# Patient Record
Sex: Male | Born: 1960
Health system: Southern US, Community
[De-identification: ages and names within clinical notes are randomized; demographics above are authoritative.]

## PROBLEM LIST (undated history)

## (undated) DIAGNOSIS — I10 Essential (primary) hypertension: Secondary | ICD-10-CM

## (undated) DIAGNOSIS — J189 Pneumonia, unspecified organism: Secondary | ICD-10-CM

## (undated) DIAGNOSIS — F32A Depression, unspecified: Secondary | ICD-10-CM

## (undated) DIAGNOSIS — G4730 Sleep apnea, unspecified: Secondary | ICD-10-CM

## (undated) DIAGNOSIS — E7800 Pure hypercholesterolemia, unspecified: Secondary | ICD-10-CM

## (undated) DIAGNOSIS — I499 Cardiac arrhythmia, unspecified: Secondary | ICD-10-CM

## (undated) DIAGNOSIS — F329 Major depressive disorder, single episode, unspecified: Secondary | ICD-10-CM

## (undated) DIAGNOSIS — Z9889 Other specified postprocedural states: Secondary | ICD-10-CM

## (undated) DIAGNOSIS — Z87442 Personal history of urinary calculi: Secondary | ICD-10-CM

## (undated) DIAGNOSIS — M1990 Unspecified osteoarthritis, unspecified site: Secondary | ICD-10-CM

## (undated) DIAGNOSIS — K219 Gastro-esophageal reflux disease without esophagitis: Secondary | ICD-10-CM

## (undated) HISTORY — DX: Other specified postprocedural states: Z98.890

## (undated) HISTORY — DX: Major depressive disorder, single episode, unspecified: F32.9

## (undated) HISTORY — DX: Cardiac arrhythmia, unspecified: I49.9

## (undated) HISTORY — PX: BACK SURGERY: SHX140

## (undated) HISTORY — DX: Depression, unspecified: F32.A

## (undated) HISTORY — PX: KNEE SURGERY: SHX244

## (undated) HISTORY — PX: HERNIA REPAIR: SHX51

---

## 2004-01-17 ENCOUNTER — Encounter: Admission: RE | Admit: 2004-01-17 | Discharge: 2004-01-17 | Payer: Self-pay | Admitting: Orthopedic Surgery

## 2004-03-16 ENCOUNTER — Encounter: Admission: RE | Admit: 2004-03-16 | Discharge: 2004-03-16 | Payer: Self-pay

## 2004-04-06 ENCOUNTER — Encounter: Admission: RE | Admit: 2004-04-06 | Discharge: 2004-04-06 | Payer: Self-pay | Admitting: Orthopedic Surgery

## 2004-09-14 ENCOUNTER — Ambulatory Visit (HOSPITAL_COMMUNITY): Admission: RE | Admit: 2004-09-14 | Discharge: 2004-09-14 | Payer: Self-pay | Admitting: General Surgery

## 2004-10-24 ENCOUNTER — Encounter: Admission: RE | Admit: 2004-10-24 | Discharge: 2004-10-24 | Payer: Self-pay | Admitting: Internal Medicine

## 2004-11-06 ENCOUNTER — Encounter: Admission: RE | Admit: 2004-11-06 | Discharge: 2004-11-06 | Payer: Self-pay | Admitting: Internal Medicine

## 2005-04-24 ENCOUNTER — Encounter: Admission: RE | Admit: 2005-04-24 | Discharge: 2005-04-24 | Payer: Self-pay | Admitting: Internal Medicine

## 2005-05-10 ENCOUNTER — Encounter: Admission: RE | Admit: 2005-05-10 | Discharge: 2005-05-10 | Payer: Self-pay | Admitting: Family Medicine

## 2005-11-28 ENCOUNTER — Encounter: Admission: RE | Admit: 2005-11-28 | Discharge: 2005-11-28 | Payer: Self-pay | Admitting: Family Medicine

## 2005-12-30 ENCOUNTER — Ambulatory Visit (HOSPITAL_COMMUNITY): Admission: RE | Admit: 2005-12-30 | Discharge: 2005-12-30 | Payer: Self-pay | Admitting: Family Medicine

## 2006-03-24 ENCOUNTER — Emergency Department (HOSPITAL_COMMUNITY): Admission: EM | Admit: 2006-03-24 | Discharge: 2006-03-24 | Payer: Self-pay | Admitting: Emergency Medicine

## 2006-04-07 ENCOUNTER — Ambulatory Visit (HOSPITAL_COMMUNITY): Admission: RE | Admit: 2006-04-07 | Discharge: 2006-04-07 | Payer: Self-pay | Admitting: Unknown Physician Specialty

## 2006-10-10 ENCOUNTER — Encounter: Admission: RE | Admit: 2006-10-10 | Discharge: 2006-10-10 | Payer: Self-pay | Admitting: Family Medicine

## 2006-12-29 ENCOUNTER — Ambulatory Visit (HOSPITAL_COMMUNITY): Admission: RE | Admit: 2006-12-29 | Discharge: 2006-12-29 | Payer: Self-pay | Admitting: Internal Medicine

## 2007-12-13 ENCOUNTER — Encounter: Payer: Self-pay | Admitting: Orthopedic Surgery

## 2007-12-14 ENCOUNTER — Ambulatory Visit: Payer: Self-pay | Admitting: Orthopedic Surgery

## 2007-12-14 DIAGNOSIS — G5600 Carpal tunnel syndrome, unspecified upper limb: Secondary | ICD-10-CM | POA: Insufficient documentation

## 2007-12-28 ENCOUNTER — Telehealth: Payer: Self-pay | Admitting: Orthopedic Surgery

## 2009-02-21 ENCOUNTER — Encounter: Payer: Self-pay | Admitting: Orthopedic Surgery

## 2010-03-08 NOTE — Letter (Signed)
Summary: [facility] Referral returnedd no reply from patient  [facility]  [address] Edmund [address]  [place], [place] [address]   Phone: [phone]  Fax: [phone]      02/21/2009  [facility] / Referrals [facility]  [phone]  / Fax [phone]    Re:    RENNE PLATTS DOB:    11/20/1960    The above named patient has been referred back to our office regarding problem:  CTS   We have made several attempts to contact Mr. Poulson via telephone and letter.  To date, we have had no response.  Office notes of previous evaluation are attached.  Thank you for your kind referral.  Please feel free to contact our office if we can be of further service.  Sincerely,   [facility] Office of Dr. Terrance Mass, MD

## 2010-06-22 NOTE — Op Note (Signed)
[name], [name]               ACCOUNT NO.:  [id number]   MEDICAL RECORD NO.:  [id number]          PATIENT TYPE:  AMB   LOCATION:  DAY                           FACILITY:  [facility]   PHYSICIAN:  Dalia Heading, M.D.  DATE OF BIRTH:  07/04/1960   DATE OF PROCEDURE:  09/14/2004  DATE OF DISCHARGE:                                 OPERATIVE REPORT   PREOPERATIVE DIAGNOSIS:  Left inguinal hernia.   POSTOPERATIVE DIAGNOSIS:  Left inguinal hernia.   PROCEDURE:  Left inguinal herniorrhaphy.   SURGEON:  Dr. Franky Macho.   ANESTHESIA:  General.   INDICATIONS:  The patient is a 50 year old white male who presents with a  symptomatic left inguinal hernia. Risks and benefits of the procedure  including bleeding, infection, pain, and the possibly of recurrence of the  hernia were fully explained to the patient, who gave informed consent.   PROCEDURE NOTE:  The patient was placed in the supine position. After  general anesthesia was administered, the left groin region was prepped and  draped using the usual sterile technique with Betadine. Surgical site  confirmation was performed.   Transverse incision was made in left groin region down to the external  oblique aponeurosis. The  aponeurosis was incised to the external ring. The  ilioinguinal nerve was identified and retracted inferiorly from the  operative field. Penrose drain was placed around the spermatic cord. A  lipoma of the cord was excised. The patient was noted to have an indirect  hernia. This was freed away from the spermatic cord and inverted. A medium-  size polypropylene mesh plug was then placed in this region, secured to the  transversalis fascia using a 2-0 Novofil interrupted suture. An onlay  polypropylene mesh patch was then placed along the floor the inguinal canal  and secured superiorly to the conjoined tendon and inferiorly to the  shelving edge of Poupart's ligament using a 2-0 Novofil interrupted suture.  The  internal ring was recreated using a 2-0 Novofil interrupted suture. The  external oblique aponeurosis was reapproximated using a 2-0 Vicryl running  suture. The subcutaneous layer was reapproximated using a 3-0 Vicryl  interrupted suture. The skin was closed using a 4-0 Vicryl subcuticular  suture. Sensorcaine 0.5% was instilled in the surrounding wound. Dermabond  was then applied.   All tape and needle counts were correct at the end of the procedure. The  patient was awakened and transferred to PACU in stable condition.   COMPLICATIONS:  None.   SPECIMEN:  None.   BLOOD LOSS:  Minimal.       MAJ/MEDQ  D:  09/14/2004  T:  09/14/2004  Job:  [id number]

## 2010-06-22 NOTE — H&P (Signed)
NAMEEZEKIAH, [name]               ACCOUNT NO.:  [id number]   MEDICAL RECORD NO.:  [id number]           PATIENT TYPE:   LOCATION:                                 FACILITY:   PHYSICIAN:  Dalia Heading, M.D.  DATE OF BIRTH:  16-Sep-1960   DATE OF ADMISSION:  09/14/2004  DATE OF DISCHARGE:  LH                                HISTORY & PHYSICAL   CHIEF COMPLAINT:  Left inguinal hernia.   HISTORY OF PRESENT ILLNESS:  The patient is a 50 year old white male who is  referred for evaluation and treatment of a left inguinal hernia.  It has  been present for many months.  It is made worse with straining.   PAST MEDICAL HISTORY:  1.  Hypertension.  2.  Depression.   PAST SURGICAL HISTORY:  Knee surgery.   CURRENT MEDICATIONS:  1.  Baby aspirin.  2.  Quinapril 20 mg p.o. every day.  3.  Lexapro 10 mg p.o. every day.  4.  Vytorin one tablet p.o. every day.  5.  Coreg 6.25 mg p.o. b.i.d.   ALLERGIES:  IVP DYE.   REVIEW OF SYSTEMS:  Noncontributory.   PHYSICAL EXAMINATION:  GENERAL:  The patient is a well-developed, well-  nourished, white male in no acute distress.  VITAL SIGNS:  He is afebrile and vital signs are stable.  LUNGS:  Clear to auscultation with equal breath sounds bilaterally.  HEART:  Reveals a regular rate and rhythm without S3, S4, or murmurs.  ABDOMEN:  Soft, nontender, nondistended.  A reducible left inguinal hernia  is noted.  GENITOURINARY:  Reveals no hydrocele.   IMPRESSION:  Left inguinal hernia.   PLAN:  The patient is scheduled for a left inguinal herniorrhaphy on September 14, 2004.  The risks and benefits of the procedure including bleeding,  infection, pain, and recurrence of the hernia were fully explained to the  patient, gave informed consent.       MAJ/MEDQ  D:  09/04/2004  T:  09/04/2004  Job:  [id number]   cc:   Dalia Heading, M.D.  [address]  [place]  [place] [address]  Fax: [phone]   [facility]  Fax:  [phone]   Corrie Mckusick, M.D.  Fax: [phone]

## 2010-07-19 ENCOUNTER — Other Ambulatory Visit (HOSPITAL_COMMUNITY): Payer: Self-pay | Admitting: Internal Medicine

## 2010-07-19 DIAGNOSIS — K4090 Unilateral inguinal hernia, without obstruction or gangrene, not specified as recurrent: Secondary | ICD-10-CM

## 2010-07-23 ENCOUNTER — Inpatient Hospital Stay (HOSPITAL_COMMUNITY): Admission: RE | Admit: 2010-07-23 | Payer: Self-pay | Source: Ambulatory Visit

## 2011-03-14 ENCOUNTER — Other Ambulatory Visit (HOSPITAL_COMMUNITY): Payer: Self-pay | Admitting: Internal Medicine

## 2011-03-14 ENCOUNTER — Ambulatory Visit (HOSPITAL_COMMUNITY)
Admission: RE | Admit: 2011-03-14 | Discharge: 2011-03-14 | Disposition: A | Discharge status: Home / Self Care | Payer: Managed Care, Other (non HMO) | Source: Ambulatory Visit | Attending: Internal Medicine | Admitting: Internal Medicine

## 2011-03-14 DIAGNOSIS — M25559 Pain in unspecified hip: Secondary | ICD-10-CM | POA: Insufficient documentation

## 2011-03-14 DIAGNOSIS — M25552 Pain in left hip: Secondary | ICD-10-CM

## 2012-05-10 ENCOUNTER — Emergency Department (HOSPITAL_COMMUNITY)
Admission: EM | Admit: 2012-05-10 | Discharge: 2012-05-10 | Disposition: A | Discharge status: Home / Self Care | Payer: 59 | Attending: Emergency Medicine | Admitting: Emergency Medicine

## 2012-05-10 ENCOUNTER — Encounter (HOSPITAL_COMMUNITY): Payer: Self-pay | Admitting: Emergency Medicine

## 2012-05-10 ENCOUNTER — Emergency Department (HOSPITAL_COMMUNITY): Payer: 59

## 2012-05-10 DIAGNOSIS — R079 Chest pain, unspecified: Secondary | ICD-10-CM

## 2012-05-10 DIAGNOSIS — I4949 Other premature depolarization: Secondary | ICD-10-CM | POA: Insufficient documentation

## 2012-05-10 DIAGNOSIS — Z862 Personal history of diseases of the blood and blood-forming organs and certain disorders involving the immune mechanism: Secondary | ICD-10-CM | POA: Insufficient documentation

## 2012-05-10 DIAGNOSIS — R011 Cardiac murmur, unspecified: Secondary | ICD-10-CM | POA: Insufficient documentation

## 2012-05-10 DIAGNOSIS — I493 Ventricular premature depolarization: Secondary | ICD-10-CM

## 2012-05-10 DIAGNOSIS — Z79899 Other long term (current) drug therapy: Secondary | ICD-10-CM | POA: Insufficient documentation

## 2012-05-10 DIAGNOSIS — I10 Essential (primary) hypertension: Secondary | ICD-10-CM | POA: Insufficient documentation

## 2012-05-10 DIAGNOSIS — R0789 Other chest pain: Secondary | ICD-10-CM | POA: Insufficient documentation

## 2012-05-10 DIAGNOSIS — R42 Dizziness and giddiness: Secondary | ICD-10-CM | POA: Insufficient documentation

## 2012-05-10 DIAGNOSIS — R0602 Shortness of breath: Secondary | ICD-10-CM | POA: Insufficient documentation

## 2012-05-10 DIAGNOSIS — Z7982 Long term (current) use of aspirin: Secondary | ICD-10-CM | POA: Insufficient documentation

## 2012-05-10 DIAGNOSIS — Z8639 Personal history of other endocrine, nutritional and metabolic disease: Secondary | ICD-10-CM | POA: Insufficient documentation

## 2012-05-10 HISTORY — DX: Essential (primary) hypertension: I10

## 2012-05-10 HISTORY — DX: Pure hypercholesterolemia, unspecified: E78.00

## 2012-05-10 LAB — COMPREHENSIVE METABOLIC PANEL
ALT: 43 U/L (ref 0–53)
AST: 33 U/L (ref 0–37)
Albumin: 4.2 g/dL (ref 3.5–5.2)
Alkaline Phosphatase: 68 U/L (ref 39–117)
BUN: 15 mg/dL (ref 6–23)
CO2: 26 mEq/L (ref 19–32)
Calcium: 9.5 mg/dL (ref 8.4–10.5)
Chloride: 99 mEq/L (ref 96–112)
Creatinine, Ser: 1.09 mg/dL (ref 0.50–1.35)
GFR calc Af Amer: >90 mL/min (ref >90)
GFR calc non Af Amer: 78 mL/min — ABNORMAL LOW (ref >90)
Glucose, Bld: 70 mg/dL (ref 70–99)
Potassium: 3.7 mEq/L (ref 3.5–5.1)
Sodium: 135 mEq/L (ref 135–145)
Total Bilirubin: 0.6 mg/dL (ref 0.3–1.2)
Total Protein: 7.3 g/dL (ref 6.0–8.3)

## 2012-05-10 LAB — CBC WITH DIFFERENTIAL/PLATELET
Basophils Absolute: 0 10*3/uL (ref 0.0–0.1)
Basophils Relative: 1 % (ref 0–1)
Eosinophils Absolute: 0.2 10*3/uL (ref 0.0–0.7)
Eosinophils Relative: 4 % (ref 0–5)
HCT: 43.9 % (ref 39.0–52.0)
Hemoglobin: 15.6 g/dL (ref 13.0–17.0)
Lymphocytes Relative: 41 % (ref 12–46)
Lymphs Abs: 2.5 10*3/uL (ref 0.7–4.0)
MCH: 30.6 pg (ref 26.0–34.0)
MCHC: 35.5 g/dL (ref 30.0–36.0)
MCV: 86.2 fL (ref 78.0–100.0)
Monocytes Absolute: 0.5 10*3/uL (ref 0.1–1.0)
Monocytes Relative: 8 % (ref 3–12)
Neutro Abs: 2.9 10*3/uL (ref 1.7–7.7)
Neutrophils Relative %: 47 % (ref 43–77)
Platelets: 195 10*3/uL (ref 150–400)
RBC: 5.09 MIL/uL (ref 4.22–5.81)
RDW: 12.9 % (ref 11.5–15.5)
WBC: 6.2 10*3/uL (ref 4.0–10.5)

## 2012-05-10 LAB — TROPONIN I
Troponin I: <0.3 ng/mL (ref <0.30)
Troponin I: <0.3 ng/mL (ref <0.30)

## 2012-05-10 LAB — D-DIMER, QUANTITATIVE: D-Dimer, Quant: 0.32 ug/mL-FEU (ref 0.00–0.48)

## 2012-05-10 NOTE — ED Notes (Addendum)
Patient c/o left side chest pain that radiates into left arm. Per patient shortness of breath but no nausea, vomiting, or dizziness. Patient states he feels like his heart is skipping a beat. Patient reports taking x2 81mg  aspirin today.

## 2012-05-10 NOTE — ED Provider Notes (Signed)
History  This chart was scribed for Joshua Lennert, MD by Bennett Scrape, ED Scribe. This patient was seen in room [facility] and the patient's care was started at 3:13 PM.  CSN: [id number]  Arrival date & time 05/10/12  1408   First MD Initiated Contact with Patient 05/10/12 1513      Chief Complaint  Patient presents with  . Chest Pain     Patient is a 52 y.o. male presenting with chest pain. The history is provided by the patient. No language interpreter was used.  Chest Pain Pain location:  L chest Pain quality: pressure   Pain radiates to:  L arm Pain radiates to the back: no   Duration:  1 day Timing:  Intermittent Chronicity:  Recurrent Associated symptoms: shortness of breath   Associated symptoms: no abdominal pain, no back pain, no cough, no fatigue, no headache, no nausea and not vomiting     [name] is a 52 y.o. male who presents to the Emergency Department complaining of intermittent, gradually worsening left-sided CP described as pressure that radiates into the left elbow with associated SOB, lightheadedness and irregular heartbeat described as skipped beats. He states that the irregular beat and chest pressure occur simultaneously. The first episode occurred about one hour after eating dinner around 9 PM. He reports that he attributed that episode to indigestion and reports that the episode resolved but returned after eating lunch today. He states that he became concerned when the episode today lasted longer than last night's episode. He admits that he has experienced similar episodes intermittently for the past several years, the last episode occurring 2 to 3 years ago. He states that he wore a cardiac monitor for a week and a murmur was noted but was otherwise normal. He is on Coreg twice a day and is on HTN medications for the murmur but was told not to worry about it. He states that he is following up with a Cardiologist currently but states "I would rather  not continue with this doctor". He reports a prior stress test 4 to 5 years ago and states that he was told a catheterization was possible if "I continued on this route".  He denies nausea, emesis, diarrhea, diaphoresis and cough as associated symptoms. He has a h/o HTN and HLD. He is an occasional alcohol user but denies smoking.  Past Medical History  Diagnosis Date  . Hypertension   . High cholesterol     Past Surgical History  Procedure Laterality Date  . Hernia repair    . Knee surgery Right     x2    Family History  Problem Relation Age of Onset  . Coronary artery disease Father     History  Substance Use Topics  . Smoking status: Never Smoker   . Smokeless tobacco: Never Used  . Alcohol Use: No     Comment: occasional      Review of Systems  Constitutional: Negative for fatigue.  HENT: Negative for congestion, sinus pressure and ear discharge.   Eyes: Negative for discharge.  Respiratory: Positive for shortness of breath. Negative for cough.   Cardiovascular: Positive for chest pain.  Gastrointestinal: Negative for nausea, vomiting, abdominal pain and diarrhea.  Genitourinary: Negative for frequency and hematuria.  Musculoskeletal: Negative for back pain.  Skin: Negative for rash.  Neurological: Positive for light-headedness. Negative for seizures and headaches.  Psychiatric/Behavioral: Negative for hallucinations.    Allergies  Iohexol  Home Medications  No current outpatient  prescriptions on file.  Triage Vitals: BP 128/77  Pulse 66  Temp(Src) 97.7 F (36.5 C)  Resp 18  Ht 6\' 3"  (1.905 m)  Wt 270 lb (122.471 kg)  BMI 33.75 kg/m2  SpO2 98%  Physical Exam  Nursing note and vitals reviewed. Constitutional: He is oriented to person, place, and time. He appears well-developed.  HENT:  Head: Normocephalic and atraumatic.  Eyes: Conjunctivae and EOM are normal. No scleral icterus.  Neck: Neck supple. No thyromegaly present.  Cardiovascular: Normal  rate and regular rhythm.  Exam reveals no gallop and no friction rub.   Murmur heard.  Systolic murmur is present with a grade of 2/6  Occasional irregularities noted  Pulmonary/Chest: No stridor. He has no wheezes. He has no rales. He exhibits no tenderness.  Abdominal: He exhibits no distension. There is no tenderness. There is no rebound.  Musculoskeletal: Normal range of motion. He exhibits no edema.  Lymphadenopathy:    He has no cervical adenopathy.  Neurological: He is oriented to person, place, and time. Coordination normal.  Skin: No rash noted. No erythema.  Psychiatric: He has a normal mood and affect. His behavior is normal.    ED Course  Procedures (including critical care time)  DIAGNOSTIC STUDIES: Oxygen Saturation is 98% on room air, normal by my interpretation.    COORDINATION OF CARE: 3:21 PM-Discussed treatment plan which includes CXR, CBC panel, CMP and troponin with pt at bedside and pt agreed to plan.   5:59 PM- Pt rechecked and is resting comfortably. Informed pt of radiology and lab work results. Advised pt that PVCs are noted on the monitor at times of the symptoms. Discussed discharge plan which includes increasing Coreg dose with pt and pt agreed to plan. Also advised pt to follow up with cardiology referral tomorrow and pt agreed.  Labs Reviewed  COMPREHENSIVE METABOLIC PANEL - Abnormal; Notable for the following:    GFR calc non Af Amer 78 (*)    All other components within normal limits  CBC WITH DIFFERENTIAL  TROPONIN I  D-DIMER, QUANTITATIVE   Dg Chest 2 View  05/10/2012  *RADIOLOGY REPORT*  Clinical Data:  Chest pain, shortness of breath and hypertension.  CHEST - 2 VIEW  Comparison: None  Findings: The heart size and mediastinal contours are within normal limits.  Both lungs are clear.  The visualized skeletal structures are unremarkable.  IMPRESSION: No active disease.   Original Report Authenticated By: Irish Lack, M.D.      No diagnosis  found.    MDM   Date: 05/10/2012  Rate: 59  Rhythm: normal sinus rhythm  QRS Axis: normal  Intervals: normal  ST/T Wave abnormalities: normal  Conduction Disutrbances:none  Narrative Interpretation:   Old EKG Reviewed: unchanged     The chart was scribed for me under my direct supervision.  I personally performed the history, physical, and medical decision making and all procedures in the evaluation of this patient.Joshua Lennert, MD 05/10/12 [phone]

## 2012-05-10 NOTE — ED Notes (Addendum)
Pt states that he had an episode of chest pain last night around 1900 after eating his dinner an hour before. Pt states that he thought it was indigestion. The pain did pass. Pt states that the pain returned this afternoon. The patients states that he started to feel the chest pain as soon as he finished lunch today. Pt states he also had diarrhea a this time. Pt states he had an episode of chest pain two years ago and that he was informed that he has an irregular heart beat and no other findings were noted. The pt states that he can feel his heart "skip a beat". Pt states that with the new onset of chest pain he feels lightheaded and that he feels like he could pass out. Pt states he has pain radiating down in left arm and into his elbow. Pt states he has numbness and tingling in his left hand. Pt states he felt like skipping a beat looked at monitor and noted PVS on the cardiac heart monitor.

## 2012-05-14 ENCOUNTER — Encounter: Payer: Self-pay | Admitting: *Deleted

## 2012-05-15 ENCOUNTER — Ambulatory Visit (INDEPENDENT_AMBULATORY_CARE_PROVIDER_SITE_OTHER): Payer: 59 | Admitting: Cardiovascular Disease

## 2012-05-15 ENCOUNTER — Encounter: Payer: Self-pay | Admitting: Cardiovascular Disease

## 2012-05-15 VITALS — BP 102/80 | HR 60 | Ht 75.0 in | Wt 271.0 lb

## 2012-05-15 DIAGNOSIS — R55 Syncope and collapse: Secondary | ICD-10-CM

## 2012-05-15 DIAGNOSIS — I493 Ventricular premature depolarization: Secondary | ICD-10-CM | POA: Insufficient documentation

## 2012-05-15 DIAGNOSIS — I4949 Other premature depolarization: Secondary | ICD-10-CM

## 2012-05-15 DIAGNOSIS — R079 Chest pain, unspecified: Secondary | ICD-10-CM

## 2012-05-15 NOTE — Assessment & Plan Note (Signed)
Event monitor to document frequency and location Continue beta blocker ETT to r/o exercise induced arrhythmia and worening.  Echo to assess LV function.  Continue coreg and can take extra dose if needed

## 2012-05-15 NOTE — Progress Notes (Signed)
Patient ID: [name], male   DOB: November 24, 1960, 52 y.o.   MRN: [id number] [name] is a 52 y.o. male who was seen in ER 4/6 complaining of intermittent, gradually worsening left-sided CP described as pressure that radiates into the left elbow with associated SOB, lightheadedness and irregular heartbeat described as skipped beats. He states that the irregular beat and chest pressure occur simultaneously. The first episode occurred about one hour after eating dinner around 9 PM. He reports that he attributed that episode to indigestion and reports that the episode resolved but returned after eating lunch today. He states that he became concerned when the episode today lasted longer than last night's episode. He admits that he has experienced similar episodes intermittently for the past several years, the last episode occurring 2 to 3 years ago. He states that he wore a cardiac monitor for a week and a murmur was noted but was otherwise normal  Seen by [facility] at that time . He is on Coreg twice a day and is on HTN medications for the murmur but was told not to worry about it. He states that he is following up with a Cardiologist currently but states "I would rather not continue with this doctor". He reports a prior stress test 4 to 5 years ago and states that he was told a catheterization was possible if "I continued on this route". He denies nausea, emesis, diarrhea, diaphoresis and cough as associated symptoms. He has a h/o HTN and HLD. He is an occasional alcohol user but denies smoking.  ROS: Denies fever, malais, weight loss, blurry vision, decreased visual acuity, cough, sputum, SOB, hemoptysis, pleuritic pain, palpitaitons, heartburn, abdominal pain, melena, lower extremity edema, claudication, or rash.  All other systems reviewed and negative   General: Affect appropriate Healthy:  appears stated age HEENT: normal Neck supple with no adenopathy JVP normal no bruits no thyromegaly Lungs  clear with no wheezing and good diaphragmatic motion Heart:  S1/S2 no murmur,rub, gallop or click PMI normal Abdomen: benighn, BS positve, no tenderness, no AAA no bruit.  No HSM or HJR Distal pulses intact with no bruits No edema Neuro non-focal Skin warm and dry No muscular weakness  Medications Current Outpatient Prescriptions  Medication Sig Dispense Refill  . aspirin EC 81 MG tablet Take 81 mg by mouth every morning.      . carvedilol (COREG) 6.25 MG tablet Take 6.25 mg by mouth 2 (two) times daily.      Marland Kitchen escitalopram (LEXAPRO) 10 MG tablet Take 10 mg by mouth every evening.      . meloxicam (MOBIC) 15 MG tablet Take 15 mg by mouth every evening.      . Multiple Vitamins-Minerals (ALIVE MENS ENERGY PO) Take 1 tablet by mouth every morning.      . quinapril (ACCUPRIL) 20 MG tablet Take 20 mg by mouth every morning.       No current facility-administered medications for this visit.    Allergies Iohexol  Family History: Family History  Problem Relation Age of Onset  . Coronary artery disease Father     Social History: History   Social History  . Marital Status: Married    Spouse Name: N/A    Number of Children: N/A  . Years of Education: N/A   Occupational History  . Not on file.   Social History Main Topics  . Smoking status: Never Smoker   . Smokeless tobacco: Never Used  . Alcohol Use: No  Comment: occasional  . Drug Use: No  . Sexually Active: Not on file   Other Topics Concern  . Not on file   Social History Narrative  . No narrative on file    Electrocardiogram:  05/12/12  NSR rate 58 normal   Assessment and Plan

## 2012-05-15 NOTE — Patient Instructions (Addendum)
Your physician recommends that you schedule a follow-up appointment in: POST TEST  Your physician has recommended that you wear an event monitor. Event monitors are medical devices that record the heart's electrical activity. Doctors most often [place] these monitors to diagnose arrhythmias. Arrhythmias are problems with the speed or rhythm of the heartbeat. The monitor is a small, portable device. You can wear one while you do your normal daily activities. This is usually used to diagnose what is causing palpitations/syncope (passing out).TILL MAY 2ND  Your physician has requested that you have an exercise tolerance test. For further information please visit [email or web address] Please also follow instruction sheet, as given.  Your physician has requested that you have an echocardiogram. Echocardiography is a painless test that uses sound waves to create images of your heart. It provides your doctor with information about the size and shape of your heart and how well your heart's chambers and valves are working. This procedure takes approximately one hour. There are no restrictions for this procedure.

## 2012-05-15 NOTE — Assessment & Plan Note (Signed)
Atypical primarily ppt by palpitations.  F/U echo and ETT as ECG is normal

## 2012-05-21 DIAGNOSIS — R002 Palpitations: Secondary | ICD-10-CM

## 2012-05-25 ENCOUNTER — Ambulatory Visit (HOSPITAL_COMMUNITY)
Admission: RE | Admit: 2012-05-25 | Discharge: 2012-05-25 | Disposition: A | Discharge status: Home / Self Care | Payer: 59 | Source: Ambulatory Visit | Attending: Cardiovascular Disease | Admitting: Cardiovascular Disease

## 2012-05-25 ENCOUNTER — Encounter (HOSPITAL_COMMUNITY): Payer: Self-pay | Admitting: Cardiovascular Disease

## 2012-05-25 ENCOUNTER — Encounter (HOSPITAL_COMMUNITY): Payer: Self-pay | Admitting: *Deleted

## 2012-05-25 DIAGNOSIS — R079 Chest pain, unspecified: Secondary | ICD-10-CM

## 2012-05-25 DIAGNOSIS — I10 Essential (primary) hypertension: Secondary | ICD-10-CM | POA: Insufficient documentation

## 2012-05-25 DIAGNOSIS — R002 Palpitations: Secondary | ICD-10-CM | POA: Insufficient documentation

## 2012-05-25 DIAGNOSIS — R0989 Other specified symptoms and signs involving the circulatory and respiratory systems: Secondary | ICD-10-CM | POA: Insufficient documentation

## 2012-05-25 DIAGNOSIS — R55 Syncope and collapse: Secondary | ICD-10-CM

## 2012-05-25 DIAGNOSIS — R0609 Other forms of dyspnea: Secondary | ICD-10-CM | POA: Insufficient documentation

## 2012-05-25 DIAGNOSIS — I517 Cardiomegaly: Secondary | ICD-10-CM

## 2012-05-25 NOTE — Progress Notes (Signed)
*  PRELIMINARY RESULTS* Echocardiogram 2D Echocardiogram has been performed.  [name] 05/25/2012, 11:08 AM

## 2012-05-25 NOTE — Progress Notes (Addendum)
Stress Lab Nurses Notes - [name]  [name] 05/25/2012 Reason for doing test: Chest Pain and Lightheadedeness Type of test: Regular GTX Nurse performing test: Parke Poisson, RN Nuclear Medicine Tech: Not Applicable Echo Tech: Not Applicable MD performing test: P. Nishan & Joni Reining NP Family MD: Dr. Sherwood Gambler Test explained and consent signed: yes IV started: No IV started Symptoms: SOB & chest Pressure Treatment/Intervention: None Reason test stopped: reached target HR After recovery IV was: NA Patient to return to Nuc. Med at : NA Patient discharged: Home Patient's Condition upon discharge was: stable Comments: During test peak BP 199/81 & HR 146.  Recovery BP 145/73 & HR 77.  Symptoms resolved in recovery. [name]  Graded Exercise Test-Interpretation      Graded exercise performed to a work load of 11.1 METs and a heart rate of 146, 85 % of age-predicted maximum.  Exercise discontinued due to dyspnea and fatigue; no chest discomfort reported.  Blood pressure increased from a resting value of 135/80 to 200/80 at peak exercise , a normal response.  Frequent PVCs noted near peak exercise.  EKG: Sinus bradycardia at a heart rate of 48 bpm; otherwise normal. Stress EKG:  Insignificant upsloping ST segment depression during exercise until peak when 1.2-1.9 mm of upsloping and flat ST segment depression recorded in leads as well as V3-V6. Rapid reversion towards normal in recovery with insignificant flat and downsloping ST segment depression later in recovery. Impression:  Borderline positive graded exercise test in the absence of symptoms to suggest ischemia.  Rapid normalization of ST segments in recovery raises the question of a potentially false positive study.  Other findings as noted.  [place] Bing, M.D.

## 2012-06-01 ENCOUNTER — Telehealth: Payer: Self-pay | Admitting: Cardiovascular Disease

## 2012-06-01 NOTE — Telephone Encounter (Signed)
Spoke to pt to advise results/instructions. Pt understood. Pt concerned per notations made by Dr Macarthur Critchley as follows:  EKG: Sinus bradycardia at a heart rate of 48 bpm; otherwise normal.  Stress EKG: Insignificant upsloping ST segment depression during exercise until peak when 1.2-1.9 mm of upsloping and flat ST segment depression recorded in leads as well as V3-V6. Rapid reversion towards normal in recovery with insignificant flat and downsloping ST segment depression later in recovery.  Impression: Borderline positive graded exercise test in the absence of symptoms to suggest ischemia. Rapid normalization of ST segments in recovery raises the question of a potentially false positive study. Other findings as noted.  [place] Bing, M.D.   Pt has upcoming f/u apt with PN on 06-15-12, please advise if pt needs to come in sooner per does not really understand the fact that the results are noted as potentially false positive study.

## 2012-06-01 NOTE — Telephone Encounter (Signed)
Minor changes on his ECG  Note all of the times that he indicated "insignificant" finding

## 2012-06-01 NOTE — Telephone Encounter (Signed)
Results of myoview / tgs  °

## 2012-06-05 ENCOUNTER — Other Ambulatory Visit: Payer: Self-pay | Admitting: *Deleted

## 2012-06-05 DIAGNOSIS — R079 Chest pain, unspecified: Secondary | ICD-10-CM

## 2012-06-05 DIAGNOSIS — R55 Syncope and collapse: Secondary | ICD-10-CM

## 2012-06-08 NOTE — Telephone Encounter (Signed)
.  left message to have patient return my call with family on home phone and vm on mobile

## 2012-06-08 NOTE — Telephone Encounter (Signed)
Spoke to pt to advise results/instructions. Pt understood. Pt concerned that his heart still skiping periodically, noted today not at all, and also he notes lightheadedness on and off as well, noted pt recent event monitor scanned into the chart, pt wanted to know if he needs to address this prior to his f/u 06-15-12

## 2012-06-08 NOTE — Telephone Encounter (Signed)
His monitor did not show a significant arrhythmia no need for further tests

## 2012-06-09 NOTE — Telephone Encounter (Signed)
Spoke to pt to advise results/instructions. Pt understood.  

## 2012-06-15 ENCOUNTER — Ambulatory Visit (INDEPENDENT_AMBULATORY_CARE_PROVIDER_SITE_OTHER): Payer: 59 | Admitting: Cardiovascular Disease

## 2012-06-15 DIAGNOSIS — I493 Ventricular premature depolarization: Secondary | ICD-10-CM

## 2012-06-15 DIAGNOSIS — R9439 Abnormal result of other cardiovascular function study: Secondary | ICD-10-CM

## 2012-06-15 DIAGNOSIS — R55 Syncope and collapse: Secondary | ICD-10-CM

## 2012-06-15 DIAGNOSIS — I4949 Other premature depolarization: Secondary | ICD-10-CM

## 2012-06-15 MED ORDER — DILTIAZEM HCL ER COATED BEADS 120 MG PO CP24: 120.0000 mg | ORAL_CAPSULE | Freq: Every day | ORAL | Status: DC

## 2012-06-15 NOTE — Progress Notes (Signed)
Patient ID: [name], male   DOB: [birth year], 52 y.o.   MRN: [id number] [name] is a 52 y.o. male who was seen in ER 4/6 complaining of intermittent, gradually worsening left-sided CP described as pressure that radiates into the left elbow with associated SOB, lightheadedness and irregular heartbeat described as skipped beats. He states that the irregular beat and chest pressure occur simultaneously. The first episode occurred about one hour after eating dinner around 9 PM. He reports that he attributed that episode to indigestion and reports that the episode resolved but returned after eating lunch today. He states that he became concerned when the episode today lasted longer than last night's episode. He admits that he has experienced similar episodes intermittently for the past several years, the last episode occurring 2 to 3 years ago. He states that he wore a cardiac monitor for a week and a murmur was noted but was otherwise normal Seen by [facility] at that time . He is on Coreg twice a day and is on HTN medications for the murmur but was told not to worry about it. He states that he is following up with a Cardiologist currently but states "I would rather not continue with this doctor". He reports a prior stress test 4 to 5 years ago and states that he was told a catheterization was possible if "I continued on this route". He denies nausea, emesis, diarrhea, diaphoresis and cough as associated symptoms. He has a h/o HTN and HLD. He is an occasional alcohol user but denies smoking.  Since his initial visit 4/14  He continues to have issues with apparent PVC;s Reports some bad enough to cause presyncope.  Reviewed Test results  ETT:  Positive in recovery and patient has strong family history with father having 9 caths beginning at early age 55/21/14 Read by Dr Dietrich Pates Stress EKG: Insignificant upsloping ST segment depression during exercise until peak when 1.2-1.9 mm of upsloping and flat ST  segment depression recorded in leads as well as V3-V6. Rapid reversion towards normal in recovery with insignificant flat and downsloping ST segment depression later in recovery.  Impression: Borderline positive graded exercise test in the absence of symptoms to suggest ischemia. Rapid normalization of ST segments in recovery raises the question of a potentially false positive study. Other findings as noted.  Echo was normal with no evidence of structural heart diseae.  Event monitor showed fairly frequent interpolated PVC;s no NSVT  ROS: Denies fever, malais, weight loss, blurry vision, decreased visual acuity, cough, sputum, SOB, hemoptysis, pleuritic pain, palpitaitons, heartburn, abdominal pain, melena, lower extremity edema, claudication, or rash.  All other systems reviewed and negative  General: Affect appropriate Healthy:  appears stated age HEENT: normal Neck supple with no adenopathy JVP normal no bruits no thyromegaly Lungs clear with no wheezing and good diaphragmatic motion Heart:  S1/S2 no murmur, no rub, gallop or click PMI normal Abdomen: benighn, BS positve, no tenderness, no AAA no bruit.  No HSM or HJR Distal pulses intact with no bruits No edema Neuro non-focal Skin warm and dry No muscular weakness   Current Outpatient Prescriptions  Medication Sig Dispense Refill  . aspirin EC 81 MG tablet Take 81 mg by mouth every morning.      . carvedilol (COREG) 6.25 MG tablet Take 6.25 mg by mouth 2 (two) times daily.      Marland Kitchen escitalopram (LEXAPRO) 10 MG tablet Take 10 mg by mouth every evening.      . meloxicam (  MOBIC) 15 MG tablet Take 15 mg by mouth every evening.      . Multiple Vitamins-Minerals (ALIVE MENS ENERGY PO) Take 1 tablet by mouth every morning.      . quinapril (ACCUPRIL) 20 MG tablet Take 20 mg by mouth every morning.       No current facility-administered medications for this visit.    Allergies  Iohexol  Electrocardiogram:  4/8  SR rate 59   Normal 05/12/12  Assessment and Plan

## 2012-06-15 NOTE — Assessment & Plan Note (Signed)
Still not clear why this would be a sudden onset issue for patient. ETT not entirely normal with strong family history Favor anatomical study to further investigate CAD with cardiac CT.  If not approved will need myovue.  Since coreg not helping and some PVC;s are calcium channel mediated in patient s with apparantly normal structural hearts will stop and start cardizem.  Relative change in HR may also stop or decrease interpolated PVCS  Will refer to EP in [place] after change in med and fu testing for CAD.

## 2012-06-15 NOTE — Patient Instructions (Addendum)
Your physician has recommended you make the following change in your medication:  1) Stop coreg (carvedilol). 2) Start cardizem CD (diltiazem) 120 mg one capsule by mouth daily.  Non-Cardiac CT scanning (cardiac CT), (CAT scanning), is a noninvasive, special x-ray that produces cross-sectional images of the body using x-rays and a computer. CT scans help physicians diagnose and treat medical conditions. For some CT exams, a contrast material is used to enhance visibility in the area of the body being studied. CT scans provide greater clarity and reveal more details than regular x-ray exams.  You have been referred to : one of the EP (electrophysiologist) in the [place] office in the next 3-4 weeks.  Your physician recommends that you schedule a follow-up appointment in: 3 months with Dr. Eden Emms in the [place] or [place] office.

## 2012-06-25 ENCOUNTER — Telehealth: Payer: Self-pay | Admitting: *Deleted

## 2012-06-25 DIAGNOSIS — R55 Syncope and collapse: Secondary | ICD-10-CM

## 2012-06-25 DIAGNOSIS — R9430 Abnormal result of cardiovascular function study, unspecified: Secondary | ICD-10-CM

## 2012-06-25 NOTE — Telephone Encounter (Signed)
Message copied by Ovidio Kin on Thu Jun 25, 2012  5:11 PM ------      Message from: Connye Burkitt      Created: Thu Jun 25, 2012  9:49 AM      Regarding: FW: Cardiac CT         Please scheduled myoview per Nishan. Cardiac CT denied      ----- Message -----         From: Connye Burkitt         Sent: 06/15/2012   3:31 PM           To: Alois Cliche, LPN, Britt Bolognese, #      Subject: Cardiac CT                                                 Let me know when pre-cert is approved. Patient has uhc      ----- Message -----         From: Jefferey Pica, RN         Sent: 06/15/2012   3:16 PM           To: Connye Burkitt            I saw this patient in [place] with Dr. Eden Emms today. Please call him to set up a cardiac CT. If denied with pre-cert, will order a myoview on him. Please let Tresa Moore, LPN in [place] know when this is set.             ------

## 2012-06-25 NOTE — Telephone Encounter (Signed)
.  left message to have patient return my call with family member Placed order in chart for pt to have myoview instead of Ct Scan per insurance Sent to [facility] to schedule apt for [facility] asap, will call pt back with date/time instructions

## 2012-06-26 ENCOUNTER — Encounter: Payer: Self-pay | Admitting: *Deleted

## 2012-06-26 NOTE — Telephone Encounter (Signed)
Noted the following:  [facility] FOR 06/30/12 NEEDS TO ARRIVE AT 8:30. [place]. STRESS MYOVIEW  Advised pt, he will call back to day to advise if he can do this apt time Instruction letter placed at front desk, pt made aware

## 2012-06-26 NOTE — Telephone Encounter (Signed)
FYI: Pt contacted [facility] and advised the following:   Pt is not going to do the test at this time due to money. States that he would like to give the new medication some time to work and see if it helps. He has made a f/u for Aug with dr Eden Emms. He will discuss stress test again at that time.

## 2012-06-30 ENCOUNTER — Encounter (HOSPITAL_COMMUNITY): Payer: 59

## 2012-06-30 ENCOUNTER — Ambulatory Visit (HOSPITAL_COMMUNITY): Payer: 59

## 2012-07-01 ENCOUNTER — Institutional Professional Consult (permissible substitution): Payer: 59 | Admitting: Internal Medicine

## 2012-07-01 NOTE — Telephone Encounter (Signed)
Called Sharonda at [address] to confirm the pt update to not have the test has been received per advised CY and PN are not in the office today, Merita Norton noted that PN is aware and the information was to be documented by PN nurse as pt advised

## 2012-09-08 ENCOUNTER — Ambulatory Visit: Payer: 59 | Admitting: Cardiovascular Disease

## 2013-01-13 ENCOUNTER — Other Ambulatory Visit: Payer: Self-pay | Admitting: *Deleted

## 2013-01-13 MED ORDER — DILTIAZEM HCL ER COATED BEADS 120 MG PO CP24: 120.0000 mg | ORAL_CAPSULE | Freq: Every day | ORAL | Status: DC

## 2013-02-17 ENCOUNTER — Telehealth: Payer: Self-pay | Admitting: Cardiovascular Disease

## 2013-02-17 MED ORDER — DILTIAZEM HCL ER COATED BEADS 120 MG PO CP24: 120.0000 mg | ORAL_CAPSULE | Freq: Every day | ORAL | Status: DC

## 2013-02-17 NOTE — Telephone Encounter (Signed)
Patient needs refill on Diltiazem but is past due for appointment.  States that they don't have the money to come for his follow up.  Advised that i would leave message for the nurse. / tgs

## 2013-02-17 NOTE — Telephone Encounter (Signed)
rx called to [facility]

## 2014-01-14 ENCOUNTER — Other Ambulatory Visit: Payer: Self-pay

## 2014-01-14 MED ORDER — DILTIAZEM HCL ER COATED BEADS 120 MG PO CP24: 120.0000 mg | ORAL_CAPSULE | Freq: Every day | ORAL | Status: DC

## 2014-02-11 ENCOUNTER — Other Ambulatory Visit: Payer: Self-pay | Admitting: Cardiovascular Disease

## 2014-05-29 IMAGING — CR DG CHEST 2V
2 series · 2 of 2 positions shown · non-contrast
Comparison: None

CLINICAL DATA: Chest pain, shortness of breath and hypertension.

CHEST - 2 VIEW

[view not recorded (1 of 2)]
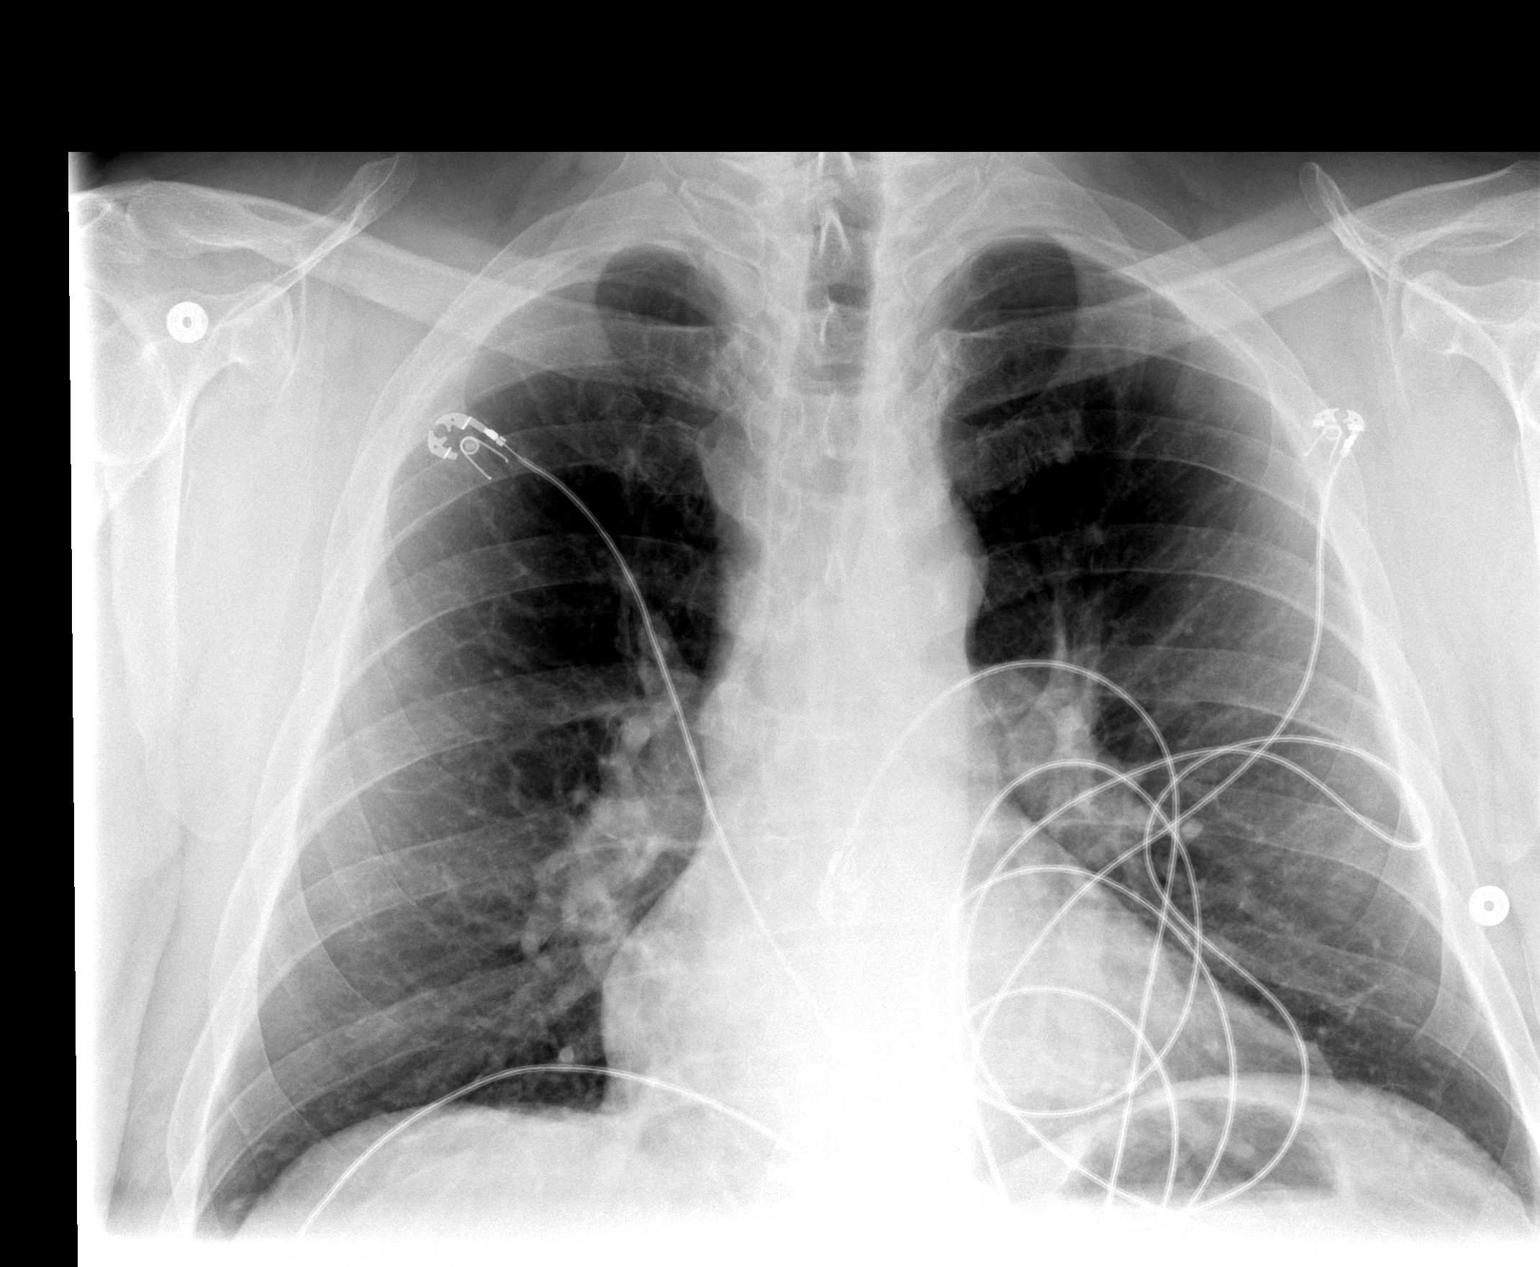

[view not recorded (2 of 2)]
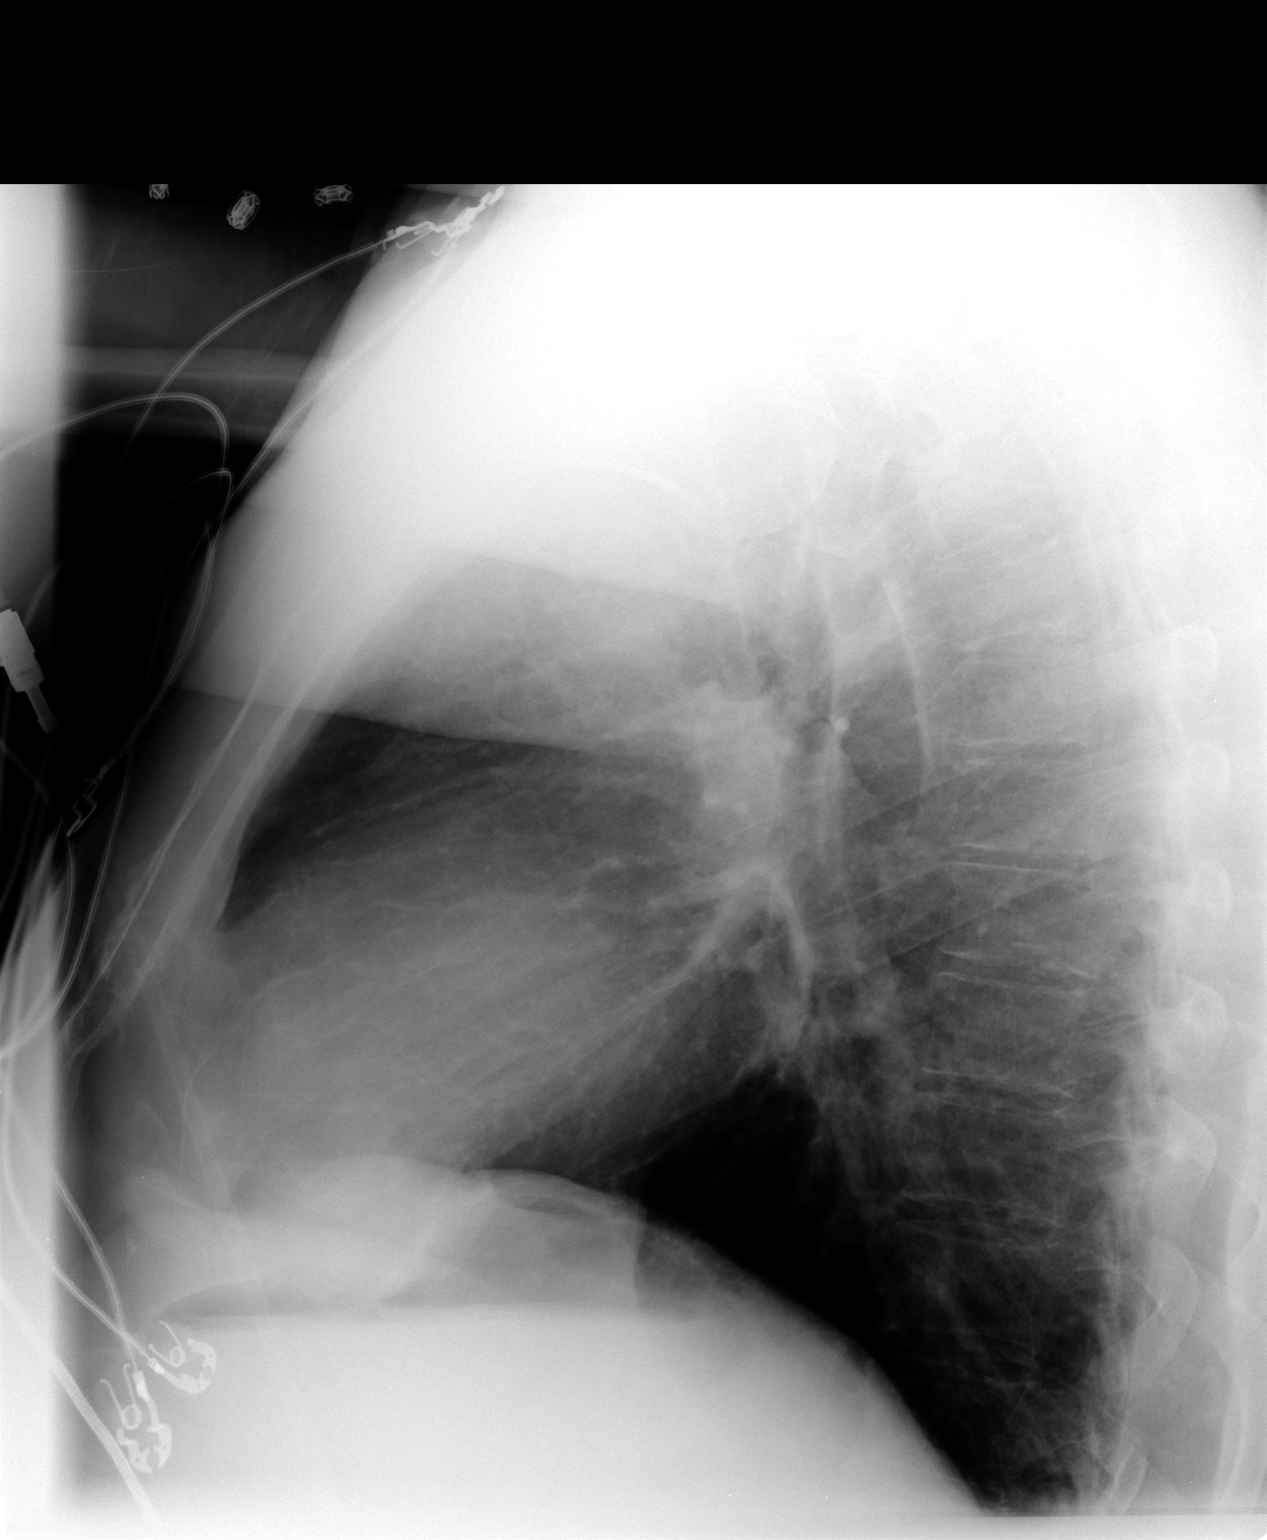

[2 of 2 positions shown; findings below may reference images not displayed]

FINDINGS: The heart size and mediastinal contours are within normal
limits.  Both lungs are clear.  The visualized skeletal structures
are unremarkable.
IMPRESSION: No active disease.

## 2014-10-14 ENCOUNTER — Ambulatory Visit (INDEPENDENT_AMBULATORY_CARE_PROVIDER_SITE_OTHER): Payer: 59 | Admitting: Neurology

## 2014-10-14 ENCOUNTER — Encounter: Payer: Self-pay | Admitting: Neurology

## 2014-10-14 VITALS — BP 104/80 | HR 68 | Ht 75.0 in | Wt 270.0 lb

## 2014-10-14 DIAGNOSIS — G4733 Obstructive sleep apnea (adult) (pediatric): Secondary | ICD-10-CM

## 2014-10-14 DIAGNOSIS — R413 Other amnesia: Secondary | ICD-10-CM

## 2014-10-14 DIAGNOSIS — E785 Hyperlipidemia, unspecified: Secondary | ICD-10-CM

## 2014-10-14 DIAGNOSIS — F329 Major depressive disorder, single episode, unspecified: Secondary | ICD-10-CM | POA: Diagnosis not present

## 2014-10-14 DIAGNOSIS — F32A Depression, unspecified: Secondary | ICD-10-CM

## 2014-10-14 DIAGNOSIS — I10 Essential (primary) hypertension: Secondary | ICD-10-CM | POA: Diagnosis not present

## 2014-10-14 NOTE — Progress Notes (Signed)
NEUROLOGY CONSULTATION NOTE  CLEMENCE Allen MRN: [id number] DOB: 07/03/1960  Referring provider: Dr. Elfredia Nevins Primary care provider: Dr. Elfredia Nevins  Reason for consult:  Memory loss  Dear Dr Sherwood Gambler:  Thank you for your kind referral of [name] for consultation of the above symptoms. Although his history is well known to you, please allow me to reiterate it for the purpose of our medical record. Records and images were personally reviewed where available.  HISTORY OF PRESENT ILLNESS: This is a 54 year old right-handed man with a history of hypertension, hyperlipidemia, depression, presenting for evaluation of worsening memory. 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 He has been working in the same job for the past 11 years. He denies getting lost driving, but he would be told to go to a certain place that he has been to dozens of times, but would have to be reminded where it is. He would be told he needs to do something, which he would forget 10 minutes later. This also happens at home with his wife telling him the same thing. He has to write things down but still forgets what he had written down. He rarely forgets to take his medications ("95% of the time"). He has noticed word-finding difficulties in the past 1-2 months, he could not recall the name of a person he has known for years, drawing a complete blank. He denies any family history of dementia. No history of significant head injuries. He usually drinks 3-4 beers a week.  He has been having a mild 2/10 frontal pressure headache for the past couple of weeks, occurring around twice a week, lasting 3-4 hours with no associated nausea, vomiting, photo or phonophobia. He denies any dizziness, diplopia, dysarthria, dysphagia, focal numbness/tingling/weakness, neck/back pain, bowel/bladder dysfunction, anosmia, or tremors. He usually gets 4-5 hours of  sleep and "fights" with his CPAP. He has been taking Lexapro  daily for the past 4-5 years, helping calm his temper down. He has a very short temper and still loses it at times, but not as he used. He reports being under significant financial stress, he has been in bankruptcy for the past 2 years.   He had an MRI brain without contrast done in 2008 for memory loss, confusion, headache, and blurred vision. He does not recall having the scan done. I personally reviewed study which was unremarkable.   PAST MEDICAL HISTORY: Past Medical History  Diagnosis Date  . Hypertension   . High cholesterol   . Depression   . Irregular heart rhythm     PAST SURGICAL HISTORY: Past Surgical History  Procedure Laterality Date  . Hernia repair    . Knee surgery Right     x2    MEDICATIONS: Current Outpatient Prescriptions on File Prior to Visit  Medication Sig Dispense Refill  . aspirin EC 81 MG tablet Take 81 mg by mouth every morning.    . diltiazem (CARDIZEM CD) 120 MG 24 hr capsule Take 1 capsule (120 mg total) by mouth daily. 30 capsule 0  . escitalopram (LEXAPRO) 10 MG tablet Take 10 mg by mouth every evening.    . meloxicam (MOBIC) 15 MG tablet Take 15 mg by mouth every evening.    . quinapril (ACCUPRIL) 20 MG tablet Take 20 mg by mouth every morning.     No current facility-administered medications on file prior to visit.  ALLERGIES: Allergies  Allergen Reactions  . Iohexol     FAMILY HISTORY: Family History  Problem Relation Age of Onset  . Coronary artery disease Father   . Heart disease Mother   . Diabetes Father     SOCIAL HISTORY: Social History   Social History  . Marital Status: Married    Spouse Name: N/A  . Number of Children: N/A  . Years of Education: N/A   Occupational History  . Not on file.   Social History Main Topics  . Smoking status: Never Smoker   . Smokeless tobacco: Never Used  . Alcohol Use: 0.0 oz/week    0 Standard drinks or equivalent  per week     Comment: 3-4 beers a week  . Drug Use: No  . Sexual Activity: Not on file   Other Topics Concern  . Not on file   Social History Narrative    REVIEW OF SYSTEMS: Constitutional: No fevers, chills, or sweats, no generalized fatigue, change in appetite Eyes: No visual changes, double vision, eye pain Ear, nose and throat: No hearing loss, ear pain, nasal congestion, sore throat Cardiovascular: No chest pain, palpitations Respiratory:  No shortness of breath at rest or with exertion, wheezes GastrointestinaI: No nausea, vomiting, diarrhea, abdominal pain, fecal incontinence Genitourinary:  No dysuria, urinary retention or frequency Musculoskeletal:  No neck pain, back pain Integumentary: No rash, pruritus, skin lesions Neurological: as above Psychiatric: +depression, insomnia, anxiety Endocrine: No palpitations, fatigue, diaphoresis, mood swings, change in appetite, change in weight, increased thirst Hematologic/Lymphatic:  No anemia, purpura, petechiae. Allergic/Immunologic: no itchy/runny eyes, nasal congestion, recent allergic reactions, rashes  PHYSICAL EXAM: Filed Vitals:   10/14/14 1244  BP: 104/80  Pulse: 68   General: No acute distress Head:  Normocephalic/atraumatic Eyes: Fundoscopic exam shows bilateral sharp discs, no vessel changes, exudates, or hemorrhages Neck: supple, no paraspinal tenderness, full range of motion Back: No paraspinal tenderness Heart: regular rate and rhythm Lungs: Clear to auscultation bilaterally. Vascular: No carotid bruits. Skin/Extremities: No rash, no edema Neurological Exam: Mental status: alert and oriented to person, place, and time, no dysarthria or aphasia, Fund of knowledge is appropriate.  Recent and remote memory are intact.  Attention and concentration are normal.    Able to name objects and repeat phrases.  MMSE - Mini Mental State Exam 10/14/2014  Orientation to time 5  Orientation to Place 5  Registration 3    Attention/ Calculation 5  Recall 2  Language- name 2 objects 2  Language- repeat 1  Language- follow 3 step command 3  Language- read & follow direction 1  Write a sentence 1  Copy design 1  Total score 29   Cranial nerves: CN I: not tested CN II: pupils equal, round and reactive to light, visual fields intact, fundi unremarkable. CN III, IV, VI:  full range of motion, no nystagmus, no ptosis CN V: facial sensation intact CN VII: upper and lower face symmetric CN VIII: hearing intact to finger rub CN IX, X: gag intact, uvula midline CN XI: sternocleidomastoid and trapezius muscles intact CN XII: tongue midline Bulk & Tone: normal, no fasciculations. Motor: 5/5 throughout with no pronator drift. Sensation: intact to light touch, cold, pin, vibration and joint position sense.  No extinction to double simultaneous stimulation.  Romberg test negative Deep Tendon Reflexes: brisk +2 throughout, no ankle clonus Plantar responses: downgoing bilaterally Cerebellar: no incoordination on finger to nose, heel to shin. No dysdiadochokinesia Gait: narrow-based and steady, able to tandem walk  adequately. Tremor: none  IMPRESSION: This is a 54 year old right-handed man with a history of hypertension, hyperlipidemia, depression, presenting for evaluation of worsening memory. His MMSE today is normal 29/30. Neurological exam normal. We discussed different causes of memory loss. If not done, recommend checking TSH and B12. We discussed effects of poorly controlled sleep apnea on memory, he reports 4-5 hours of sleep due to "fighting" with CPAP, would recommend having CPAP settings checked. We also discussed pseudodementia and effects of mood on memory. He endorses depression with his situation of being in bankruptcy, and will speak to Dr. Sherwood Gambler about increasing Lexapro to 20mg  daily. Counseling may be helpful. We discussed that if memory issues continue despite treatment of depression and sleep apnea,  we will proceed with brain imaging. We discussed the importance of control of vascular risk factors, physical exercise, and brain stimulation exercises for brain health. He will follow-up in 6 months and knows to call our office for any changes.   Thank you for allowing me to participate in the care of this patient. Please do not hesitate to call for any questions or concerns.   Patrcia Dolly, M.D.  CC: Dr. Sherwood Gambler

## 2014-10-14 NOTE — Patient Instructions (Signed)
1. Discuss increasing Lexapro dose with Dr. Sherwood Gambler 2. Have CPAP machine checked once able to see if it is at the right settings for you 3. Physical exercise and brain stimulation exercises (crossword puzzles, word search, etc) are important for brain health 4. Follow-up in 6 months

## 2014-10-15 DIAGNOSIS — E785 Hyperlipidemia, unspecified: Secondary | ICD-10-CM | POA: Insufficient documentation

## 2014-10-15 DIAGNOSIS — G4733 Obstructive sleep apnea (adult) (pediatric): Secondary | ICD-10-CM | POA: Insufficient documentation

## 2014-10-15 DIAGNOSIS — I10 Essential (primary) hypertension: Secondary | ICD-10-CM | POA: Insufficient documentation

## 2014-10-25 ENCOUNTER — Ambulatory Visit: Payer: Self-pay | Admitting: Neurology

## 2015-04-14 ENCOUNTER — Ambulatory Visit: Payer: 59 | Admitting: Neurology

## 2015-11-24 ENCOUNTER — Other Ambulatory Visit (HOSPITAL_COMMUNITY): Payer: 59

## 2015-11-30 ENCOUNTER — Ambulatory Visit: Admit: 2015-11-30 | Payer: 59 | Admitting: Ophthalmology

## 2015-11-30 SURGERY — PHACOEMULSIFICATION, CATARACT, WITH IOL INSERTION
Anesthesia: Monitor Anesthesia Care | Site: Eye | Laterality: Right

## 2016-12-19 ENCOUNTER — Telehealth: Payer: Self-pay

## 2016-12-19 NOTE — Telephone Encounter (Signed)
Pt called to set up his first colonoscopy. No GI issues, no blood thinners, no heart attacks. Please call him at [phone]

## 2016-12-30 ENCOUNTER — Telehealth: Payer: Self-pay

## 2016-12-30 NOTE — Telephone Encounter (Signed)
See separate triage.  

## 2017-01-30 NOTE — Telephone Encounter (Signed)
Gastroenterology Pre-Procedure Review  Request Date: 12/30/2016 Requesting Physician: Dr. Phillips OdorGolding  PATIENT REVIEW QUESTIONS: The patient responded to the following health history questions as indicated:    This will be the pt's first colonoscopy  1. Diabetes Melitis: no 2. Joint replacements in the past 12 months: no 3. Major health problems in the past 3 months: no 4. Has an artificial valve or MVP: no 5. Has a defibrillator: no 6. Has been advised in past to take antibiotics in advance of a procedure like teeth cleaning: no 7. Family history of colon cancer: NO  8. Alcohol Use: Rarely 9. History of sleep apnea: YES   HAS C PAP 10. History of coronary artery or other vascular stents placed within the last 12 months: no 11. History of any prior anesthesia complications: no    MEDICATIONS & ALLERGIES:    Patient reports the following regarding taking any blood thinners:   Plavix? no Aspirin? yes Coumadin? no Brilinta? no Xarelto? no Eliquis? no Pradaxa? no Savaysa? no Effient? no  Patient confirms/reports the following medications:  Current Outpatient Medications  Medication Sig Dispense Refill  . aspirin EC 81 MG tablet Take 81 mg by mouth every morning.    . diltiazem (CARDIZEM CD) 120 MG 24 hr capsule Take 1 capsule (120 mg total) by mouth daily. 30 capsule 0  . escitalopram (LEXAPRO) 10 MG tablet Take 10 mg by mouth every evening.    . meloxicam (MOBIC) 15 MG tablet Take 15 mg by mouth every evening. Takes one half tablet daily    . quinapril (ACCUPRIL) 20 MG tablet Take 20 mg by mouth every morning.     No current facility-administered medications for this visit.     Patient confirms/reports the following allergies:  Allergies  Allergen Reactions  . Iohexol     No orders of the defined types were placed in this encounter.   AUTHORIZATION INFORMATION Primary Insurance:   ID #:  Group #:  Pre-Cert / Auth required: Pre-Cert / Auth #:   Secondary Insurance:   ID #:  Group #:  Pre-Cert / Auth required:  Pre-Cert / Auth #:   SCHEDULE INFORMATION: Procedure has been scheduled as follows:  Date:  02/17/2017               Time:  8:30 aM Location: [facility] Short Stay  This Gastroenterology Pre-Precedure Review Form is being routed to the following provider(s): Jonette EvaSandi Fields, MD

## 2017-02-05 ENCOUNTER — Other Ambulatory Visit: Payer: Self-pay

## 2017-02-05 DIAGNOSIS — Z1211 Encounter for screening for malignant neoplasm of colon: Secondary | ICD-10-CM

## 2017-02-05 MED ORDER — NA SULFATE-K SULFATE-MG SULF 17.5-3.13-1.6 GM/177ML PO SOLN: 1.0000 | ORAL | 0 refills | Status: DC

## 2017-02-05 NOTE — Telephone Encounter (Signed)
Rx sent to the pharmacy and instructions mailed to pt.  

## 2017-02-05 NOTE — Telephone Encounter (Signed)
Appropriate.

## 2017-02-10 ENCOUNTER — Telehealth: Payer: Self-pay | Admitting: Gastroenterology

## 2017-02-10 MED ORDER — PEG 3350-KCL-NA BICARB-NACL 420 G PO SOLR: 4000.0000 mL | ORAL | 0 refills | Status: DC

## 2017-02-10 NOTE — Telephone Encounter (Signed)
Pt called to say that he hasn't received any instructions on his colonoscopy scheduled for 1/14 with SF. He also asked if we have a coupon or something cheaper in his prep. I told him that I would have to ask the nurse that scheduled his procedure and have her call him back. He said that he or his brother could come by the office one day this week to pick up the instructions. [phone]

## 2017-02-10 NOTE — Telephone Encounter (Signed)
Pt is aware instructions were mailed on 02/05/2017. He would like a cheaper prep. I am sending in [place] and faxing instructions to the pharmacy. He is aware to disregard the previous instructions for Suprep when he gets it in the mail.

## 2017-02-11 ENCOUNTER — Telehealth: Payer: Self-pay

## 2017-02-11 NOTE — Telephone Encounter (Signed)
I called [employer] and spoke to [place] [phone] for the screening colonoscopy.

## 2017-02-12 ENCOUNTER — Ambulatory Visit (INDEPENDENT_AMBULATORY_CARE_PROVIDER_SITE_OTHER): Payer: 59

## 2017-02-12 ENCOUNTER — Encounter (HOSPITAL_COMMUNITY): Payer: Self-pay | Admitting: Emergency Medicine

## 2017-02-12 ENCOUNTER — Ambulatory Visit (HOSPITAL_COMMUNITY)
Admission: EM | Admit: 2017-02-12 | Discharge: 2017-02-12 | Disposition: A | Discharge status: Home / Self Care | Payer: 59 | Attending: Family Medicine | Admitting: Family Medicine

## 2017-02-12 ENCOUNTER — Other Ambulatory Visit: Payer: Self-pay

## 2017-02-12 DIAGNOSIS — M545 Low back pain: Secondary | ICD-10-CM | POA: Diagnosis not present

## 2017-02-12 MED ORDER — HYDROCODONE-ACETAMINOPHEN 5-325 MG PO TABS: 1.0000 | ORAL_TABLET | Freq: Four times a day (QID) | ORAL | 0 refills | Status: AC | PRN

## 2017-02-12 NOTE — ED Provider Notes (Signed)
[facility]    CSN: [id number] Arrival date & time: 02/12/17  1406   History   Chief Complaint Chief Complaint  Patient presents with  . Back Pain    HPI AVARY PITSENBARGER is a 57 y.o. male presenting with acute back pain. States pain began on Monday and was mild and achy, started to improve this morning, but after he dropped off a truck load and got gas he got back in the truck cab and upon exiting he had extreme pain with tingling into bilateral legs, pain up into left ribcage. MRI in 2008 showing disc herniation at L2/L3, anterolisthesis at L5  And disc degeneration with protrusion at L5/S1. Received cortisone shots in the past that provided minimal relief, has not had any issues since. Denies loss of bowel/bladder control, denies saddle anesthesia.   HPI  Past Medical History:  Diagnosis Date  . Depression   . High cholesterol   . Hypertension   . Irregular heart rhythm     Patient Active Problem List   Diagnosis Date Noted  . Essential hypertension 10/15/2014  . Hyperlipidemia 10/15/2014  . Obstructive sleep apnea 10/15/2014  . Memory loss 10/14/2014  . Depression 10/14/2014  . Chest pain 05/15/2012  . PVC (premature ventricular contraction) 05/15/2012  . CARPAL TUNNEL SYNDROME 12/14/2007    Past Surgical History:  Procedure Laterality Date  . HERNIA REPAIR    . KNEE SURGERY Right    x2       Home Medications    Prior to Admission medications   Medication Sig Start Date End Date Taking? Authorizing Provider  aspirin EC 81 MG tablet Take 81 mg by mouth daily.    Yes [provider]  diltiazem (CARDIZEM CD) 120 MG 24 hr capsule Take 1 capsule (120 mg total) by mouth daily. 01/14/14  Yes Josue Hector, MD  escitalopram (LEXAPRO) 20 MG tablet Take 20 mg by mouth daily.   Yes [provider]  ibuprofen (ADVIL,MOTRIN) 200 MG tablet Take 400-600 mg by mouth daily as needed for headache.   Yes [provider]  meloxicam  (MOBIC) 7.5 MG tablet Take 7.5 mg by mouth daily.   Yes [provider]  Multiple Vitamin (MULTIVITAMIN WITH MINERALS) TABS tablet Take 1 tablet by mouth daily.   Yes [provider]  quinapril (ACCUPRIL) 20 MG tablet Take 20 mg by mouth daily.    Yes [provider]  HYDROcodone-acetaminophen (NORCO/VICODIN) 5-325 MG tablet Take 1 tablet by mouth every 6 (six) hours as needed for up to 4 days for severe pain. 02/12/17 02/16/17  Brandolyn Shortridge C, PA-C  Na Sulfate-K Sulfate-Mg Sulf (SUPREP BOWEL PREP KIT) 17.5-3.13-1.6 GM/177ML SOLN Take 1 kit by mouth as directed. Patient not taking: Reported on 02/10/2017 02/05/17   Annitta Needs, NP  polyethylene glycol-electrolytes (TRILYTE) 420 g solution Take 4,000 mLs by mouth as directed. 02/10/17   Annitta Needs, NP    Family History Family History  Problem Relation Age of Onset  . Coronary artery disease Father   . Diabetes Father   . Heart disease Mother     Social History Social History   Tobacco Use  . Smoking status: Never Smoker  . Smokeless tobacco: Never Used  Substance Use Topics  . Alcohol use: Yes    Alcohol/week: 0.0 oz    Comment: 3-4 beers a week  . Drug use: No     Allergies   Iohexol   Review of Systems Review of  Systems  Constitutional: Negative for chills and fever.  Eyes: Negative for pain and visual disturbance.  Respiratory: Negative for cough and shortness of breath.   Cardiovascular: Negative for chest pain and palpitations.  Gastrointestinal: Negative for abdominal pain, diarrhea, nausea and vomiting.  Genitourinary: Negative for decreased urine volume, difficulty urinating and dysuria.  Musculoskeletal: Positive for back pain. Negative for arthralgias.  Skin: Negative for color change and rash.  Neurological: Negative for dizziness, syncope, weakness, light-headedness, numbness and headaches.       Tingling in lower extrmities  All other systems reviewed and are  negative.    Physical Exam Triage Vital Signs ED Triage Vitals  Enc Vitals Group     BP 02/12/17 1428 (!) 147/87     Pulse Rate 02/12/17 1428 62     Resp 02/12/17 1428 18     Temp 02/12/17 1428 98.4 F (36.9 C)     Temp src --      SpO2 02/12/17 1428 100 %     Weight --      Height --      Head Circumference --      Peak Flow --      Pain Score 02/12/17 1430 9     Pain Loc --      Pain Edu? --      Excl. in [place]? --    No data found.  Updated Vital Signs BP (!) 147/87   Pulse 62   Temp 98.4 F (36.9 C)   Resp 18   SpO2 100%    Physical Exam  Constitutional: He appears well-developed and well-nourished.  Patient appearing uncomfortable  HENT:  Head: Normocephalic and atraumatic.  Eyes: Conjunctivae are normal.  Neck: Neck supple.  Cardiovascular: Normal rate and regular rhythm.  No murmur heard. Pulmonary/Chest: Effort normal and breath sounds normal. No respiratory distress.  Abdominal: Soft. There is no tenderness.  Musculoskeletal: He exhibits no edema.  Cervical, thoracic and lumbar spine without deformity or bony tenderness, no increased pain with palpation to lumbar muscles, patient point to bilateral lumbar area when asked about pain and moves laterally on both sides. Palpation does not cause pain. SLR limited due to pain.  Patient able to ambulate from chair to table, slowly and with awkaward posture to compensate pain.   Hip strength 5/5 bilaterally  Neurological: He is alert.  Skin: Skin is warm and dry.  Psychiatric: He has a normal mood and affect.  Nursing note and vitals reviewed.    UC Treatments / Results  Labs (all labs ordered are listed, but only abnormal results are displayed) Labs Reviewed - No data to display  EKG  EKG Interpretation None       Radiology Dg Lumbar Spine Complete  Result Date: 02/12/2017 CLINICAL DATA:  Acute back pain. EXAM: LUMBAR SPINE - COMPLETE 4+ VIEW COMPARISON:  Radiographs 02/16/2015 and MRI  01/09/2015 FINDINGS: Bilateral pars defects at L5 are again demonstrated with a grade 1 spondylolisthesis. The other lumbar vertebral bodies are normally aligned. Mild multilevel disc disease and facet disease. Advanced disc disease and facet disease at L5-S1. No acute bony findings. The visualized bony pelvis is intact. The SI joints appear normal. IMPRESSION: Stable pars defects at L5 with grade 1 spondylolisthesis and associated degenerative disc disease and facet disease at L5-S1. No acute bony findings. Electronically Signed   By: Marijo Sanes M.D.   On: 02/12/2017 16:11    Procedures Procedures (including critical care time)  Medications Ordered in UC  Medications - No data to display   Initial Impression / Assessment and Plan / UC Course  I have reviewed the triage vital signs and the nursing notes.  Pertinent labs & imaging results that were available during my care of the patient were reviewed by me and considered in my medical decision making (see chart for details).     Lumbar spine findings consistent with MR from 2008. Back pain likely from herniated disc vs. Muscular strain. Norco provided for temporary relief of severe pain. Anti-inflammatories for mild-moderate pain. Follow up with ortho for further imaging and management. Discussed strict return precautions. Patient verbalized understanding and is agreeable with plan.    Final Clinical Impressions(s) / UC Diagnoses   Final diagnoses:  Acute bilateral low back pain, with sciatica presence unspecified    ED Discharge Orders        Ordered    HYDROcodone-acetaminophen (NORCO/VICODIN) 5-325 MG tablet  Every 6 hours PRN     02/12/17 1604       Controlled Substance Prescriptions [place] Controlled Substance Registry consulted?Yes, checked 2 prescriptions in past 2 years. Benefit> Risk.    Janith Lima, [place] 02/12/17 1932

## 2017-02-12 NOTE — Discharge Instructions (Addendum)
Xray:Stable pars defects at L5 with grade 1 spondylolisthesis and associated degenerative disc disease and facet disease at L5-S1.  Please follow up with PCP and/or Orthopedics for further imaging of spine.  I have given you stronger pain medicine for severe pain.  Use anti-inflammatories for pain/swelling. You may take up to 800 mg Ibuprofen every 8 hours with food. You may supplement Ibuprofen with Tylenol [phone] mg every 8 hours.   Please alternate using ice/heating pad to help with any discomfort.   Please follow up here or with your primary care if symptoms not improving in 2 weeks. Please return sooner if pain changes, worsens, develop numbness, tingling.

## 2017-02-12 NOTE — ED Triage Notes (Signed)
Pt states Monday morning he woke up with mid lower back pain, thought he just slept wrong, over the course of 2 days pain got severe, pt visibly uncomfortable. Pain worse with ambulation. Denies issues with urination. Hx of back issues. States he has hx of a bulging disc

## 2017-02-13 ENCOUNTER — Telehealth: Payer: Self-pay

## 2017-02-13 ENCOUNTER — Telehealth (HOSPITAL_COMMUNITY): Payer: Self-pay | Admitting: Emergency Medicine

## 2017-02-13 MED ORDER — CYCLOBENZAPRINE HCL 10 MG PO TABS: 10.0000 mg | ORAL_TABLET | Freq: Three times a day (TID) | ORAL | 0 refills | Status: DC | PRN

## 2017-02-13 NOTE — Telephone Encounter (Signed)
Pt called office and LMOVM. He has TCS scheduled for 02/17/17. He has a bulging disk flare that started yesterday and wants to know if it will affect having his TCS.  Routing to DS.

## 2017-02-13 NOTE — Telephone Encounter (Signed)
Pt called...  Sts he is also needing a muscle relaxer along w/pain meds given  Per Dr. Tracie HarrierHagler... Ok to call in Flexeril 10 mg TID PRN #21 no refills  [facility] per pt's request.

## 2017-02-13 NOTE — Telephone Encounter (Signed)
I called and spoke to [facility] @ [employer] and made her aware that the date has been changed to 03/17/2017. PA # remains the same.  Reference number for this call is [id number].

## 2017-02-13 NOTE — Telephone Encounter (Signed)
Pt said he has bulging disks at L5 and S1. He was in severe pain yesterday. He is taking medication and wants to reschedule his procedure. He has been rescheduled for 03/17/2017 at 10:30 Am with Dr. Darrick PennaFields. He has his prep and has not opened it. I am mailing new instructions and Eber JonesCarolyn is aware of the change in Endo.

## 2017-02-20 DIAGNOSIS — M5416 Radiculopathy, lumbar region: Secondary | ICD-10-CM | POA: Insufficient documentation

## 2017-03-12 NOTE — Telephone Encounter (Signed)
PT called and is still out of work on [employer]. Not sure when he can do procedure.  We are cancelling and he will call me as soon as he is able and I will reschedule.  I called and LMOM for Eber JonesCarolyn that he has cancelled.

## 2017-03-17 ENCOUNTER — Encounter (HOSPITAL_COMMUNITY): Payer: Self-pay

## 2017-03-17 ENCOUNTER — Ambulatory Visit (HOSPITAL_COMMUNITY): Admit: 2017-03-17 | Payer: 59 | Admitting: Gastroenterology

## 2017-03-17 SURGERY — COLONOSCOPY
Anesthesia: Moderate Sedation

## 2017-04-21 ENCOUNTER — Ambulatory Visit: Payer: Self-pay | Admitting: Orthopedic Surgery

## 2017-04-29 ENCOUNTER — Ambulatory Visit: Payer: Self-pay | Admitting: Orthopedic Surgery

## 2017-04-29 NOTE — Pre-Procedure Instructions (Signed)
Del Mena GoesL Daris  04/29/2017      [facility] - [place], [place] - [id number] [place] [id number] Phone: [phone] Fax: [phone]    Your procedure is scheduled on Thurs., May 08, 2017  [facility]   Call this number if you have problems the morning of surgery:  [phone]   Remember:  Do not eat food or drink liquids after midnight.  Take these medicines the morning of surgery with A SIP OF WATER: Diltiazem (CARDIZEM CD), Escitalopram (LEXAPRO), and Gabapentin (NEURONTIN).  Follow your doctor's instruction regarding Aspirin.  7 days before surgery (Mar. 28), stop taking all Aspirins, Vitamins, Fish oils, and Herbal medications. Also stop all NSAIDS i.e. Advil, Ibuprofen, Motrin, Aleve, Anaprox, Naproxen, BC and Goody Powders. Including: Meloxicam (MOBIC) and Bismuth subsalicylate (PEPTO BISMOL).   Do not wear jewelry.  Do not wear lotions, powders, colognes, or deodorant.  Do not shave 48 hours prior to surgery.  Men may shave face and neck.  Do not bring valuables to the hospital.  [facility] is not responsible for any belongings or valuables.  Contacts, dentures or bridgework may not be worn into surgery.  Leave your suitcase in the car.  After surgery it may be brought to your room.  For patients admitted to the hospital, discharge time will be determined by your treatment team.  Patients discharged the day of surgery will not be allowed to drive home.   Special instructions:   [place]- Preparing For Surgery  Before surgery, you can play an important role. Because skin is not sterile, your skin needs to be as free of germs as possible. You can reduce the number of germs on your skin by washing with CHG (chlorahexidine gluconate) Soap before surgery.  CHG is an antiseptic cleaner which kills germs and bonds with the skin to continue killing germs even after washing.  Please do not use  if you have an allergy to CHG or antibacterial soaps. If your skin becomes reddened/irritated stop using the CHG.  Do not shave (including legs and underarms) for at least 48 hours prior to first CHG shower. It is OK to shave your face.  Please follow these instructions carefully.   1. Shower the NIGHT BEFORE SURGERY and the MORNING OF SURGERY with CHG.   2. If you chose to wash your hair, wash your hair first as usual with your normal shampoo.  3. After you shampoo, rinse your hair and body thoroughly to remove the shampoo.  4. Use CHG as you would any other liquid soap. You can apply CHG directly to the skin and wash gently with a scrungie or a clean washcloth.   5. Apply the CHG Soap to your body ONLY FROM THE NECK DOWN.  Do not use on open wounds or open sores. Avoid contact with your eyes, ears, mouth and genitals (private parts). Wash Face and genitals (private parts)  with your normal soap.  6. Wash thoroughly, paying special attention to the area where your surgery will be performed.  7. Thoroughly rinse your body with warm water from the neck down.  8. DO NOT shower/wash with your normal soap after using and rinsing off the CHG Soap.  9. Pat yourself dry with a CLEAN TOWEL.  10. Wear CLEAN PAJAMAS to bed the night before surgery, wear comfortable clothes the morning of surgery  11. Place CLEAN SHEETS on your bed the night of your first  shower and DO NOT SLEEP WITH PETS.  Day of Surgery: Do not apply any deodorants/lotions. Please wear clean clothes to the hospital/surgery center.    Please read over the following fact sheets that you were given. Pain Booklet, Coughing and Deep Breathing, MRSA Information and Surgical Site Infection Prevention

## 2017-04-30 ENCOUNTER — Encounter (HOSPITAL_COMMUNITY): Payer: Self-pay

## 2017-04-30 ENCOUNTER — Ambulatory Visit: Payer: Self-pay | Admitting: Orthopedic Surgery

## 2017-04-30 ENCOUNTER — Encounter (HOSPITAL_COMMUNITY)
Admission: RE | Admit: 2017-04-30 | Discharge: 2017-04-30 | Disposition: A | Discharge status: Home / Self Care | Payer: No Typology Code available for payment source | Source: Ambulatory Visit | Attending: Orthopedic Surgery | Admitting: Orthopedic Surgery

## 2017-04-30 ENCOUNTER — Encounter (HOSPITAL_COMMUNITY)
Admission: RE | Admit: 2017-04-30 | Discharge: 2017-04-30 | Disposition: A | Discharge status: Home / Self Care | Payer: No Typology Code available for payment source | Source: Ambulatory Visit | Attending: Specialist | Admitting: Specialist

## 2017-04-30 ENCOUNTER — Other Ambulatory Visit: Payer: Self-pay

## 2017-04-30 DIAGNOSIS — Z01810 Encounter for preprocedural cardiovascular examination: Secondary | ICD-10-CM | POA: Diagnosis not present

## 2017-04-30 DIAGNOSIS — M5126 Other intervertebral disc displacement, lumbar region: Secondary | ICD-10-CM | POA: Diagnosis not present

## 2017-04-30 DIAGNOSIS — Z01812 Encounter for preprocedural laboratory examination: Secondary | ICD-10-CM | POA: Insufficient documentation

## 2017-04-30 HISTORY — DX: Personal history of urinary calculi: Z87.442

## 2017-04-30 HISTORY — DX: Sleep apnea, unspecified: G47.30

## 2017-04-30 HISTORY — DX: Unspecified osteoarthritis, unspecified site: M19.90

## 2017-04-30 HISTORY — DX: Gastro-esophageal reflux disease without esophagitis: K21.9

## 2017-04-30 HISTORY — DX: Pneumonia, unspecified organism: J18.9

## 2017-04-30 LAB — BASIC METABOLIC PANEL
ANION GAP: 11 (ref 5–15)
BUN: 12 mg/dL (ref 6–20)
CALCIUM: 9.3 mg/dL (ref 8.9–10.3)
CO2: 20 mmol/L — AB (ref 22–32)
CREATININE: 1.02 mg/dL (ref 0.61–1.24)
Chloride: 107 mmol/L (ref 101–111)
GFR calc Af Amer: >60 mL/min (ref >60)
GLUCOSE: 100 mg/dL — AB (ref 65–99)
Potassium: 4.4 mmol/L (ref 3.5–5.1)
Sodium: 138 mmol/L (ref 135–145)

## 2017-04-30 LAB — CBC
HCT: 43.8 % (ref 39.0–52.0)
HEMOGLOBIN: 15 g/dL (ref 13.0–17.0)
MCH: 30.1 pg (ref 26.0–34.0)
MCHC: 34.2 g/dL (ref 30.0–36.0)
MCV: 88 fL (ref 78.0–100.0)
PLATELETS: 191 10*3/uL (ref 150–400)
RBC: 4.98 MIL/uL (ref 4.22–5.81)
RDW: 13.6 % (ref 11.5–15.5)
WBC: 7.1 10*3/uL (ref 4.0–10.5)

## 2017-04-30 LAB — SURGICAL PCR SCREEN
MRSA, PCR: NEGATIVE
STAPHYLOCOCCUS AUREUS: POSITIVE — AB

## 2017-04-30 NOTE — Progress Notes (Signed)
Anesthesia chart review: Patient is a 57 year old male scheduled for L2-3 microlumbar decompression on 05/08/2017 by Dr. Jene Every.  History includes never smoker, HTN, hypercholesterolemia, depression, irregular heart rate (PVCs, PACs), GERD, OSA (CPAP), arthritis, left inguinal hernia repair '06, knee surgery. BMI is consistent with obesity.   PCP is Dr. Elfredia Nevins, reportedly seen for surgical clearance on 04/30/17. Records pending.   He is not currently seeing a cardiologist, but has seen cardiologist Dr. Charlton Haws in the past. Established 05/15/12 following ED visit for chest pain. ETT was borderline but no symptoms of ischemia. Holter showed occasional to frequent PVCs. On last visit 06/15/12, Dr. Eden Emms wrote, "Still not clear why this [PVCs] would be a sudden onset issue for patient. ETT not entirely normal with strong family history Favor anatomical study to further investigate CAD with cardiac CT.  If not approved will need myovue.  Since coreg not helping and some PVC;s are calcium channel mediated in patient s with apparantly normal structural hearts will stop and start cardizem.  Relative change in HR may also stop or decrease interpolated PVCS  Will refer to EP in [place] after change in med and fu testing for CAD." Patient's insurance would not cover cardiac CT and wanted Myoview done first. Patient did not have the Myoview done due to cost. Dr. Eden Emms was to readdress in 09/2012, but appointment never happened. (Patient reports symptoms improved with Cardizem which has been refilled by Dr. Sherwood Gambler, so he has not gone back to cardiology.)   Meds include aspirin 81 mg (holding 5 days prior to surgery), Cardizem CD, Lexapro, Neurontin, quinapril.  BP 140/81   Pulse (!) 54   Temp 36.6 C   Resp 20   Ht 6\' 3"  (1.905 m)   Wt 294 lb 11.2 oz (133.7 kg)   SpO2 98%   BMI 36.83 kg/m   He denied SOB, fever, cough, chest pain, syncope. When he first was evaluated for chest pain in 2014 he  was having skipped beats associated with chest pressure days at a time. Since starting on Cardizem symptoms only occur about every 3-4 months and last for ~ 5 minutes. He will still have associated chest pressure that correlates with his "extra" beats. He denied associated SOB, diaphoresis, nausea, jaw or arm pain, syncope/pre-syncope. He reported that he was active until 02/12/17 when he stepped out of his truck and developed acute back pain which never resolved. Since his back symptoms, his activity is limited due to pain, and he has gained weight from inactivity.  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 One of his job sites required walking up 70 stairs and he reported that he was able to do that without chest pain or significant SOB. He denied history of smoking or DM. He reports his mother had atherosclerosis of her aorta and died during what sounds like aorto-bifemoral bypass surgery at [facility] when she was 55 years old. He did not recall her having a AAA. His father had his first MI with LV dysfunction at age 8 and had bradycardia but did not require a PPM.   Exam shows a pleasant Caucasian male in NAD. No conversational dyspnea. He walks independently, but has a cane. Heart RRR. I did not appreciate a murmur. Lungs clear. No carotid bruits noted. No ankle edema. Mallampati II.   EKG 04/30/17: SB at 53 bpm.   Event monitor 05/15/12-06/04/12: Monitor with SR,  SB, sinus arrhythmia, PAC;s and PVC. Occasional to frequent PVCs (specific count not provided), mainly unifocal, but also bigeminy and few couplets. No NSVT/VT or other abnormalities. Symptoms correlated with PVCs.  Echo 05/25/12: Study Conclusions Left ventricle: The cavity size was normal. There was mild concentric hypertrophy. Systolic function was normal. The estimated ejection fraction was in the range of 55% to 60%. Wall motion was normal; there were no  regional wall motion abnormalities.    Exercise Stress test 05/25/12:  EKG: Sinus bradycardia at a heart rate of 48 bpm; otherwise normal. Stress EKG:  Insignificant upsloping ST segment depression during exercise until peak when 1.2-1.9 mm of upsloping and flat ST segment depression recorded in leads as well as V3-V6. Rapid reversion towards normal in recovery with insignificant flat and downsloping ST segment depression later in recovery. Impression:  Borderline positive graded exercise test in the absence of symptoms to suggest ischemia.  Rapid normalization of ST segments in recovery raises the question of a potentially false positive study.  Other findings as noted.  Preoperative labs noted. Glucose 100. Cr 1.02. CBC WNL.    Patient with persistent symptomatic PVCs in 2014 that he describes as only rare now that he has been on Cardizem. He never went back to see cardiology because his symptoms improved, but has been seeing his longtime PCP who has been refilling Cardizem. He reports that prior to his back pain starting 02/2017 his activity was not limited and he could walk up 70 stairs without CV symptoms. His EKG showed SB, but no significant ST/T wave changes and no PVCs. He received medical clearance from Dr. Sherwood GamblerFusco (Dr. Ermelinda DasBeane's office to fax letter) following today's visit. Reviewed currently available information with anesthesiologist Dr. Jairo Benarswell Jackson. Patient without exertional symptoms and had good activity tolerance until January when developed back pain symptoms. No history of smoking or DM. If no acute changes then it is anticipated that he can proceed as planned.    Velna Ochsllison Karon Cotterill, PA-C [facility]/Anesthesiology Phone [phone] 04/30/2017 3:04 PM

## 2017-04-30 NOTE — Progress Notes (Signed)
PCP - Dr. Sherwood GamblerFusco Cardiologist - patient denies  Chest x-ray - n/a EKG - 04/30/2017 Stress Test - 05/25/2012 ECHO - 05/25/2012 Cardiac Cath - patient denies  Sleep Study - patient states it was 12+ years ago and he is not sure of the name of the place where he had it done CPAP - yes  Aspirin Instructions: patient stated he usually stops 5 days prior to surgery; was instructed to contact Dr. Sharyon MedicusFusco's office  Anesthesia review: yes, per surgeon order.  Patient denies shortness of breath, fever, cough and chest pain at PAT appointment   Patient verbalized understanding of instructions that were given to them at the PAT appointment. Patient was also instructed that they will need to review over the PAT instructions again at home before surgery.

## 2017-05-05 ENCOUNTER — Ambulatory Visit: Payer: Self-pay | Admitting: Orthopedic Surgery

## 2017-05-05 NOTE — H&P (View-Only) (Signed)
[name] is an 57 y.o. male.   Chief Complaint: back and right leg pain, numbness, weakness HPI: Reason for Visit: (normal) visit for: (lumbar spine)  Context: work injury (DOI 02/12/2017); The patient is 5 weeks, 5 days out from the injury  Location (Lower Extremity): lower back pain ; leg pain on the right, , anterior Severity: pain level 5/10  Aggravating Factors: standing for 10 min  Are you working? The patient currently has light duty restrictions of sedentary work only, however, he is out of work due to no light duty available.  Medications: The patient is currently taking Mobic, gabapentin and Robaxin Notes: The patient is 13 days s/p ESI L5-S1. The patient reports the injection helped for one day.  Patient follows up. Reports no help from physical therapy still having pain numbness or weakness down the right leg. He is at sedentary position at work. Would like a refill on his gabapentin. Taking Mobic.  Past Medical History:  Diagnosis Date  . Arthritis    "right knee"  . Depression   . GERD (gastroesophageal reflux disease)   . High cholesterol   . History of kidney stones    "33 years ago"  . Hypertension   . Irregular heart rhythm   . Pneumonia   . Sleep apnea    wears CPAP   Meds aspirin dilTIAZem CD 120 mg capsule,extended release 24 hr escitalopram 20 mg tablet meloxicam 7.5 mg tablet methocarbamol 500 mg tablet Neurontin 300 mg capsule quinapril 20 mg tablet  Past Surgical History:  Procedure Laterality Date  . HERNIA REPAIR    . KNEE SURGERY Right    x2    Family History  Problem Relation Age of Onset  . Coronary artery disease Father   . Diabetes Father   . Heart disease Mother    Social History:  reports that he has never smoked. He has never used smokeless tobacco. He reports that he drinks alcohol. He reports that he does not use drugs.  Allergies:  Allergies  Allergen Reactions  . Iohexol Palpitations    Increased blood pressure       (Not in a hospital admission)  No results found for this or any previous visit (from the past 48 hour(s)). No results found.  Review of Systems  Constitutional: Negative.   HENT: Negative.   Eyes: Negative.   Respiratory: Negative.   Cardiovascular: Negative.   Gastrointestinal: Negative.   Genitourinary: Negative.   Musculoskeletal: Positive for back pain.  Skin: Negative.   Neurological: Positive for sensory change and focal weakness.  Psychiatric/Behavioral: Negative.     There were no vitals taken for this visit. Physical Exam  Constitutional: He is oriented to person, place, and time. He appears well-developed and well-nourished.  HENT:  Head: Normocephalic.  Eyes: Pupils are equal, round, and reactive to light.  Neck: Normal range of motion.  Cardiovascular: Normal rate.  Respiratory: Effort normal.  GI: Soft.  Musculoskeletal:  Patient is a 57 year old male.  Constitutional: General Appearance: healthy-appearing and distress (mild).  Psychiatric: Mood and Affect: active and alert and normal affect.  Cardiovascular System: Edema Right: none; Dorsalis and posterior tibial pulses 2+. Edema Left: none.  Cervical Spine: Inspection: alignment normal and no muscle atrophy. Bony Palpation: no tenderness of the spinous process.  Motor Strength: Neck Strength (Intrinsics): extension 5/5. C5 on the Right: abduction deltoid 5/5. C5 on the Left: abduction deltoid 5/5. C6 on the Right: flexion biceps 5/5. C6 on the Left: flexion biceps  5/5. C7 on the Right: extension triceps 5/5 and flexion wrist 5/5. C7 on the Left: extension triceps 5/5 and flexion wrist 5/5. C8 on the Right: flexion fingers 5/5. C8 on the Left: flexion fingers 5/5. T1 on the Right: abduction fingers 5/5. T1 on the Left: abduction fingers 5/5.  Neurological System: Biceps Reflex Right: normal (2). Biceps Reflex Left: normal on the left (2). Brachioradialis Reflex Right: normal (2). Brachioradialis Reflex  Left: normal (2). Triceps Reflex Right: normal (2). Triceps Reflex Left: normal (2). Sensation on the Right: normal median nerve distribution and ulnar nerve distribution and C5 normal, C6 normal, C7 normal, C8 normal, T1 normal, and sensation of the distal extremities normal. Sensation on the Left: normal median nerve distribution and ulnar nerve distribution and C5 normal, C6 normal, C7 normal, T1 normal, and distal extremities normal. Special Tests on the Right: Spurling's test negative. Special Tests on the Left: Spurling's test negative. Special Tests: Hoffman Sign negative. No Babinski or Clonus. Knee Reflex Right: diminished (1). Knee Reflex Left: normal (2). Ankle Reflex Right: normal (2). Ankle Reflex Left: normal (2). Babinski Reflex Right: plantar reflex absent. Babinski Reflex Left: plantar reflex absent. Special Tests on the Right: no clonus of the ankle/knee and seated straight leg raising test positive. Special Tests on the Left: no clonus of the ankle/knee.  Skin: Head and Neck: normal. Right Upper Extremity: normal. Left Upper Extremity: normal. Inspection and palpation: no rash.  Gait and Station: Appearance: ambulating with no assistive devices and antalgic gait.  Abdomen: Inspection and Palpation: non-distended and no tenderness.  Lumbar Spine: Inspection: normal alignment. Bony Palpation of the Lumbar Spine: tender at lumbosacral junction.. Bony Palpation of the Right Hip: no tenderness of the greater trochanter and tenderness of the SI joint; Pelvis stable. Bony Palpation of the Left Hip: no tenderness of the greater trochanter and tenderness of the SI joint. Soft Tissue Palpation on the Right: No flank pain with percussion. Active Range of Motion: limited flexion and extention.  Motor Strength: L1 Motor Strength on the Right: hip flexion iliopsoas 5/5. L1 Motor Strength on the Left: hip flexion iliopsoas 4/5. L2-L4 Motor Strength on the Right: knee extension quadriceps 4/5. L2-L4  Motor Strength on the Left: knee extension quadriceps 5/5. L5 Motor Strength on the Right: ankle dorsiflexion tibialis anterior 5/5 and great toe extension extensor hallucis longus 5/5. L5 Motor Strength on the Left: ankle dorsiflexion tibialis anterior 5/5 and great toe extension extensor hallucis longus 5/5. S1 Motor Strength on the Right: plantar flexion gastrocnemius 5/5. S1 Motor Strength on the Left: plantar flexion gastrocnemius 5/5.  Decreased sensation L3 dermatome.  Neurological: He is alert and oriented to person, place, and time.     Assessment/Plan 1. Lumbar radiculopathy 2. Lumbar spondylolisthesis 3. Prolapsed lumbar intervertebral disc 4. Degeneration of lumbar intervertebral disc  Patient demonstrates refractory L2-L3 radiculopathy secondary to a large disc herniation at L2-3. He has persistent quadriceps weakness as well as hip flexor weakness and decreased sensation in the L3 dermatome. He is not had any therapeutic benefit from formal supervised physical therapy or an epidural steroid injection or medications. Discussed options for him at this point time living with his symptoms versus a microlumbar decompression. He feels that he can no longer live with his symptoms or except the continued symptomatology without improvement. I therefore feel that 6 weeks from his injury and the presence of a neural compressive lesion with a neurologic deficit and failing conservative treatment I would therefore recommend microlumbar decompression at L2-3.  I obtained AP lateral radiographs of the lumbar spine in flexion determined at L2-3 the necessary bony resection required to achieve a lumbar decompression centrally at 2 3.  In the interim I gave her a refill on his Robaxin  Sedentary position as tolerated.  I had an extensive discussion with the patient concerning the pathology relevant anatomy and treatment options. At this point exhausting conservative treatment and in the presence of  a neurologic deficit we discussed microlumbar decompression. I discussed the risks and benefits including bleeding, infection, DVT, PE, anesthetic complications, worsening in their symptoms, improvement in their symptoms, C SF leakage, epidural fibrosis, need for future surgeries such as revision discectomy and lumbar fusion. I also indicated that this is an operation to basically decompress the nerve root to allow recovery as opposed to fixing a herniated disc and that the incidence of recurrent chest disc herniation can approach 15%. Also that nerve root recovery is variable and may not recover completely.  I discussed the operative course including overnight in the hospital. Immediate ambulation. Follow-up in 2 weeks for suture removal. 6 weeks until healing of the herniation followed by 6 weeks of reconditioning and strengthening of the core musculature. Also discussed the need to employ the concepts of disc pressure management and core motion following the surgery to minimize the risk of recurrent disc herniation. We will obtain preoperative clearance i if necessary and proceed accordingly.  Plan microlumbar decompression L2-3  Dorothy Spark., PA-C for Dr. Shelle Iron 05/05/2017, 10:28 AM

## 2017-05-05 NOTE — H&P (Signed)
[name] is an 57 y.o. male.   Chief Complaint: back and right leg pain, numbness, weakness HPI: Reason for Visit: (normal) visit for: (lumbar spine)  Context: work injury (DOI 02/12/2017); The patient is 5 weeks, 5 days out from the injury  Location (Lower Extremity): lower back pain ; leg pain on the right, , anterior Severity: pain level 5/10  Aggravating Factors: standing for 10 min  Are you working? The patient currently has light duty restrictions of sedentary work only, however, he is out of work due to no light duty available.  Medications: The patient is currently taking Mobic, gabapentin and Robaxin Notes: The patient is 13 days s/p ESI L5-S1. The patient reports the injection helped for one day.  Patient follows up. Reports no help from physical therapy still having pain numbness or weakness down the right leg. He is at sedentary position at work. Would like a refill on his gabapentin. Taking Mobic.  Past Medical History:  Diagnosis Date  . Arthritis    "right knee"  . Depression   . GERD (gastroesophageal reflux disease)   . High cholesterol   . History of kidney stones    "33 years ago"  . Hypertension   . Irregular heart rhythm   . Pneumonia   . Sleep apnea    wears CPAP   Meds aspirin dilTIAZem CD 120 mg capsule,extended release 24 hr escitalopram 20 mg tablet meloxicam 7.5 mg tablet methocarbamol 500 mg tablet Neurontin 300 mg capsule quinapril 20 mg tablet  Past Surgical History:  Procedure Laterality Date  . HERNIA REPAIR    . KNEE SURGERY Right    x2    Family History  Problem Relation Age of Onset  . Coronary artery disease Father   . Diabetes Father   . Heart disease Mother    Social History:  reports that he has never smoked. He has never used smokeless tobacco. He reports that he drinks alcohol. He reports that he does not use drugs.  Allergies:  Allergies  Allergen Reactions  . Iohexol Palpitations    Increased blood pressure       (Not in a hospital admission)  No results found for this or any previous visit (from the past 48 hour(s)). No results found.  Review of Systems  Constitutional: Negative.   HENT: Negative.   Eyes: Negative.   Respiratory: Negative.   Cardiovascular: Negative.   Gastrointestinal: Negative.   Genitourinary: Negative.   Musculoskeletal: Positive for back pain.  Skin: Negative.   Neurological: Positive for sensory change and focal weakness.  Psychiatric/Behavioral: Negative.     There were no vitals taken for this visit. Physical Exam  Constitutional: He is oriented to person, place, and time. He appears well-developed and well-nourished.  HENT:  Head: Normocephalic.  Eyes: Pupils are equal, round, and reactive to light.  Neck: Normal range of motion.  Cardiovascular: Normal rate.  Respiratory: Effort normal.  GI: Soft.  Musculoskeletal:  Patient is a 57 year old male.  Constitutional: General Appearance: healthy-appearing and distress (mild).  Psychiatric: Mood and Affect: active and alert and normal affect.  Cardiovascular System: Edema Right: none; Dorsalis and posterior tibial pulses 2+. Edema Left: none.  Cervical Spine: Inspection: alignment normal and no muscle atrophy. Bony Palpation: no tenderness of the spinous process.  Motor Strength: Neck Strength (Intrinsics): extension 5/5. C5 on the Right: abduction deltoid 5/5. C5 on the Left: abduction deltoid 5/5. C6 on the Right: flexion biceps 5/5. C6 on the Left: flexion biceps  5/5. C7 on the Right: extension triceps 5/5 and flexion wrist 5/5. C7 on the Left: extension triceps 5/5 and flexion wrist 5/5. C8 on the Right: flexion fingers 5/5. C8 on the Left: flexion fingers 5/5. T1 on the Right: abduction fingers 5/5. T1 on the Left: abduction fingers 5/5.  Neurological System: Biceps Reflex Right: normal (2). Biceps Reflex Left: normal on the left (2). Brachioradialis Reflex Right: normal (2). Brachioradialis Reflex  Left: normal (2). Triceps Reflex Right: normal (2). Triceps Reflex Left: normal (2). Sensation on the Right: normal median nerve distribution and ulnar nerve distribution and C5 normal, C6 normal, C7 normal, C8 normal, T1 normal, and sensation of the distal extremities normal. Sensation on the Left: normal median nerve distribution and ulnar nerve distribution and C5 normal, C6 normal, C7 normal, T1 normal, and distal extremities normal. Special Tests on the Right: Spurling's test negative. Special Tests on the Left: Spurling's test negative. Special Tests: Hoffman Sign negative. No Babinski or Clonus. Knee Reflex Right: diminished (1). Knee Reflex Left: normal (2). Ankle Reflex Right: normal (2). Ankle Reflex Left: normal (2). Babinski Reflex Right: plantar reflex absent. Babinski Reflex Left: plantar reflex absent. Special Tests on the Right: no clonus of the ankle/knee and seated straight leg raising test positive. Special Tests on the Left: no clonus of the ankle/knee.  Skin: Head and Neck: normal. Right Upper Extremity: normal. Left Upper Extremity: normal. Inspection and palpation: no rash.  Gait and Station: Appearance: ambulating with no assistive devices and antalgic gait.  Abdomen: Inspection and Palpation: non-distended and no tenderness.  Lumbar Spine: Inspection: normal alignment. Bony Palpation of the Lumbar Spine: tender at lumbosacral junction.. Bony Palpation of the Right Hip: no tenderness of the greater trochanter and tenderness of the SI joint; Pelvis stable. Bony Palpation of the Left Hip: no tenderness of the greater trochanter and tenderness of the SI joint. Soft Tissue Palpation on the Right: No flank pain with percussion. Active Range of Motion: limited flexion and extention.  Motor Strength: L1 Motor Strength on the Right: hip flexion iliopsoas 5/5. L1 Motor Strength on the Left: hip flexion iliopsoas 4/5. L2-L4 Motor Strength on the Right: knee extension quadriceps 4/5. L2-L4  Motor Strength on the Left: knee extension quadriceps 5/5. L5 Motor Strength on the Right: ankle dorsiflexion tibialis anterior 5/5 and great toe extension extensor hallucis longus 5/5. L5 Motor Strength on the Left: ankle dorsiflexion tibialis anterior 5/5 and great toe extension extensor hallucis longus 5/5. S1 Motor Strength on the Right: plantar flexion gastrocnemius 5/5. S1 Motor Strength on the Left: plantar flexion gastrocnemius 5/5.  Decreased sensation L3 dermatome.  Neurological: He is alert and oriented to person, place, and time.     Assessment/Plan 1. Lumbar radiculopathy 2. Lumbar spondylolisthesis 3. Prolapsed lumbar intervertebral disc 4. Degeneration of lumbar intervertebral disc  Patient demonstrates refractory L2-L3 radiculopathy secondary to a large disc herniation at L2-3. He has persistent quadriceps weakness as well as hip flexor weakness and decreased sensation in the L3 dermatome. He is not had any therapeutic benefit from formal supervised physical therapy or an epidural steroid injection or medications. Discussed options for him at this point time living with his symptoms versus a microlumbar decompression. He feels that he can no longer live with his symptoms or except the continued symptomatology without improvement. I therefore feel that 6 weeks from his injury and the presence of a neural compressive lesion with a neurologic deficit and failing conservative treatment I would therefore recommend microlumbar decompression at L2-3.  I obtained AP lateral radiographs of the lumbar spine in flexion determined at L2-3 the necessary bony resection required to achieve a lumbar decompression centrally at 2 3.  In the interim I gave her a refill on his Robaxin  Sedentary position as tolerated.  I had an extensive discussion with the patient concerning the pathology relevant anatomy and treatment options. At this point exhausting conservative treatment and in the presence of  a neurologic deficit we discussed microlumbar decompression. I discussed the risks and benefits including bleeding, infection, DVT, PE, anesthetic complications, worsening in their symptoms, improvement in their symptoms, C SF leakage, epidural fibrosis, need for future surgeries such as revision discectomy and lumbar fusion. I also indicated that this is an operation to basically decompress the nerve root to allow recovery as opposed to fixing a herniated disc and that the incidence of recurrent chest disc herniation can approach 15%. Also that nerve root recovery is variable and may not recover completely.  I discussed the operative course including overnight in the hospital. Immediate ambulation. Follow-up in 2 weeks for suture removal. 6 weeks until healing of the herniation followed by 6 weeks of reconditioning and strengthening of the core musculature. Also discussed the need to employ the concepts of disc pressure management and core motion following the surgery to minimize the risk of recurrent disc herniation. We will obtain preoperative clearance i if necessary and proceed accordingly.  Plan microlumbar decompression L2-3  Dorothy Spark., PA-C for Dr. Shelle Iron 05/05/2017, 10:28 AM

## 2017-05-07 MED ORDER — CLINDAMYCIN PHOSPHATE 900 MG/50ML IV SOLN
900.0000 mg | INTRAVENOUS | Status: AC
  Administered 2017-05-08: 900 mg via INTRAVENOUS
  Filled 2017-05-07: qty 50

## 2017-05-07 MED ORDER — DEXTROSE 5 % IV SOLN
3.0000 g | INTRAVENOUS | Status: AC
  Administered 2017-05-08: 3 g via INTRAVENOUS
  Filled 2017-05-07: qty 3

## 2017-05-07 MED ORDER — ACETAMINOPHEN 10 MG/ML IV SOLN
1000.0000 mg | INTRAVENOUS | Status: AC
  Administered 2017-05-08: 1000 mg via INTRAVENOUS
  Filled 2017-05-07: qty 100

## 2017-05-08 ENCOUNTER — Ambulatory Visit (HOSPITAL_COMMUNITY): Payer: No Typology Code available for payment source

## 2017-05-08 ENCOUNTER — Observation Stay (HOSPITAL_COMMUNITY)
Admission: RE | Admit: 2017-05-08 | Discharge: 2017-05-09 | Disposition: A | Discharge status: Home / Self Care | Payer: No Typology Code available for payment source | Source: Ambulatory Visit | Attending: Specialist | Admitting: Specialist

## 2017-05-08 ENCOUNTER — Ambulatory Visit (HOSPITAL_COMMUNITY): Payer: No Typology Code available for payment source | Admitting: Vascular Surgery

## 2017-05-08 ENCOUNTER — Ambulatory Visit (HOSPITAL_COMMUNITY)
Admission: RE | Disposition: A | Discharge status: Home / Self Care | Payer: Self-pay | Source: Ambulatory Visit | Attending: Specialist

## 2017-05-08 ENCOUNTER — Ambulatory Visit (HOSPITAL_COMMUNITY): Payer: No Typology Code available for payment source | Admitting: Anesthesiology

## 2017-05-08 ENCOUNTER — Encounter (HOSPITAL_COMMUNITY): Payer: Self-pay | Admitting: Urology

## 2017-05-08 DIAGNOSIS — M48061 Spinal stenosis, lumbar region without neurogenic claudication: Secondary | ICD-10-CM | POA: Insufficient documentation

## 2017-05-08 DIAGNOSIS — F329 Major depressive disorder, single episode, unspecified: Secondary | ICD-10-CM | POA: Insufficient documentation

## 2017-05-08 DIAGNOSIS — M419 Scoliosis, unspecified: Secondary | ICD-10-CM | POA: Insufficient documentation

## 2017-05-08 DIAGNOSIS — R339 Retention of urine, unspecified: Secondary | ICD-10-CM | POA: Diagnosis not present

## 2017-05-08 DIAGNOSIS — Z79899 Other long term (current) drug therapy: Secondary | ICD-10-CM | POA: Diagnosis not present

## 2017-05-08 DIAGNOSIS — I10 Essential (primary) hypertension: Secondary | ICD-10-CM | POA: Insufficient documentation

## 2017-05-08 DIAGNOSIS — Z7982 Long term (current) use of aspirin: Secondary | ICD-10-CM | POA: Insufficient documentation

## 2017-05-08 DIAGNOSIS — Z791 Long term (current) use of non-steroidal anti-inflammatories (NSAID): Secondary | ICD-10-CM | POA: Insufficient documentation

## 2017-05-08 DIAGNOSIS — M5126 Other intervertebral disc displacement, lumbar region: Secondary | ICD-10-CM

## 2017-05-08 DIAGNOSIS — G4730 Sleep apnea, unspecified: Secondary | ICD-10-CM | POA: Insufficient documentation

## 2017-05-08 DIAGNOSIS — I499 Cardiac arrhythmia, unspecified: Secondary | ICD-10-CM | POA: Diagnosis not present

## 2017-05-08 DIAGNOSIS — M5116 Intervertebral disc disorders with radiculopathy, lumbar region: Principal | ICD-10-CM | POA: Insufficient documentation

## 2017-05-08 DIAGNOSIS — M4316 Spondylolisthesis, lumbar region: Secondary | ICD-10-CM | POA: Insufficient documentation

## 2017-05-08 DIAGNOSIS — Z419 Encounter for procedure for purposes other than remedying health state, unspecified: Secondary | ICD-10-CM

## 2017-05-08 HISTORY — PX: LUMBAR LAMINECTOMY/DECOMPRESSION MICRODISCECTOMY: SHX5026

## 2017-05-08 SURGERY — LUMBAR LAMINECTOMY/DECOMPRESSION MICRODISCECTOMY 1 LEVEL
Anesthesia: General | Site: Back

## 2017-05-08 MED ORDER — THROMBIN (RECOMBINANT) 20000 UNITS EX SOLR
CUTANEOUS | Status: DC | PRN
  Administered 2017-05-08: 13:00:00 via TOPICAL

## 2017-05-08 MED ORDER — OXYCODONE HCL 5 MG PO TABS: 5.0000 mg | ORAL_TABLET | ORAL | Status: DC | PRN

## 2017-05-08 MED ORDER — DEXAMETHASONE SODIUM PHOSPHATE 10 MG/ML IJ SOLN
INTRAMUSCULAR | Status: AC
  Filled 2017-05-08: qty 1

## 2017-05-08 MED ORDER — DEXTROSE 5 % IV SOLN
3.0000 g | Freq: Three times a day (TID) | INTRAVENOUS | Status: AC
  Administered 2017-05-08 – 2017-05-09 (×2): 3 g via INTRAVENOUS
  Filled 2017-05-08 (×2): qty 3000

## 2017-05-08 MED ORDER — PROPOFOL 10 MG/ML IV BOLUS
INTRAVENOUS | Status: AC
  Filled 2017-05-08: qty 20

## 2017-05-08 MED ORDER — FENTANYL CITRATE (PF) 100 MCG/2ML IJ SOLN
INTRAMUSCULAR | Status: DC | PRN
  Administered 2017-05-08: 200 ug via INTRAVENOUS
  Administered 2017-05-08: 100 ug via INTRAVENOUS
  Administered 2017-05-08: 50 ug via INTRAVENOUS

## 2017-05-08 MED ORDER — ALUM & MAG HYDROXIDE-SIMETH 200-200-20 MG/5ML PO SUSP
30.0000 mL | Freq: Four times a day (QID) | ORAL | Status: DC | PRN
  Administered 2017-05-08: 30 mL via ORAL
  Filled 2017-05-08: qty 30

## 2017-05-08 MED ORDER — ROCURONIUM BROMIDE 10 MG/ML (PF) SYRINGE
PREFILLED_SYRINGE | INTRAVENOUS | Status: AC
  Filled 2017-05-08: qty 10

## 2017-05-08 MED ORDER — FENTANYL CITRATE (PF) 250 MCG/5ML IJ SOLN
INTRAMUSCULAR | Status: AC
  Filled 2017-05-08: qty 5

## 2017-05-08 MED ORDER — MENTHOL 3 MG MT LOZG: 1.0000 | LOZENGE | OROMUCOSAL | Status: DC | PRN

## 2017-05-08 MED ORDER — GLYCOPYRROLATE 0.2 MG/ML IJ SOLN
INTRAMUSCULAR | Status: DC | PRN
  Administered 2017-05-08: 0.2 mg via INTRAVENOUS

## 2017-05-08 MED ORDER — ONDANSETRON HCL 4 MG PO TABS: 4.0000 mg | ORAL_TABLET | Freq: Four times a day (QID) | ORAL | Status: DC | PRN

## 2017-05-08 MED ORDER — METHOCARBAMOL 1000 MG/10ML IJ SOLN
500.0000 mg | Freq: Four times a day (QID) | INTRAVENOUS | Status: DC | PRN
  Filled 2017-05-08: qty 5

## 2017-05-08 MED ORDER — DEXAMETHASONE SODIUM PHOSPHATE 10 MG/ML IJ SOLN
INTRAMUSCULAR | Status: DC | PRN
  Administered 2017-05-08: 10 mg via INTRAVENOUS

## 2017-05-08 MED ORDER — POLYETHYLENE GLYCOL 3350 17 G PO PACK: 17.0000 g | PACK | Freq: Every day | ORAL | Status: DC | PRN

## 2017-05-08 MED ORDER — ACETAMINOPHEN 325 MG PO TABS: 650.0000 mg | ORAL_TABLET | ORAL | Status: DC | PRN

## 2017-05-08 MED ORDER — SODIUM CHLORIDE 0.9 % IV SOLN: 250.0000 mL | INTRAVENOUS | Status: DC

## 2017-05-08 MED ORDER — MIDAZOLAM HCL 2 MG/2ML IJ SOLN
INTRAMUSCULAR | Status: AC
  Filled 2017-05-08: qty 2

## 2017-05-08 MED ORDER — SODIUM CHLORIDE 0.9 % IV SOLN
INTRAVENOUS | Status: DC | PRN
  Administered 2017-05-08: 13:00:00

## 2017-05-08 MED ORDER — 0.9 % SODIUM CHLORIDE (POUR BTL) OPTIME
TOPICAL | Status: DC | PRN
  Administered 2017-05-08: 1000 mL

## 2017-05-08 MED ORDER — SUCCINYLCHOLINE CHLORIDE 200 MG/10ML IV SOSY
PREFILLED_SYRINGE | INTRAVENOUS | Status: DC | PRN
  Administered 2017-05-08: 70 mg via INTRAVENOUS

## 2017-05-08 MED ORDER — SODIUM CHLORIDE 0.9% FLUSH: 3.0000 mL | INTRAVENOUS | Status: DC | PRN

## 2017-05-08 MED ORDER — SUGAMMADEX SODIUM 500 MG/5ML IV SOLN
INTRAVENOUS | Status: DC | PRN
  Administered 2017-05-08: 300 mg via INTRAVENOUS

## 2017-05-08 MED ORDER — ACETAMINOPHEN 650 MG RE SUPP: 650.0000 mg | RECTAL | Status: DC | PRN

## 2017-05-08 MED ORDER — OXYCODONE HCL 5 MG PO TABS
10.0000 mg | ORAL_TABLET | ORAL | Status: DC | PRN
  Administered 2017-05-08: 10 mg via ORAL
  Filled 2017-05-08 (×2): qty 2

## 2017-05-08 MED ORDER — MAGNESIUM CITRATE PO SOLN: 1.0000 | Freq: Once | ORAL | Status: DC | PRN

## 2017-05-08 MED ORDER — ZOLPIDEM TARTRATE 5 MG PO TABS: 5.0000 mg | ORAL_TABLET | Freq: Every evening | ORAL | Status: DC | PRN

## 2017-05-08 MED ORDER — ESCITALOPRAM OXALATE 10 MG PO TABS
20.0000 mg | ORAL_TABLET | Freq: Every day | ORAL | Status: DC
  Administered 2017-05-09: 20 mg via ORAL
  Filled 2017-05-08 (×2): qty 2

## 2017-05-08 MED ORDER — PROPOFOL 10 MG/ML IV BOLUS
INTRAVENOUS | Status: DC | PRN
  Administered 2017-05-08 (×2): 50 mg via INTRAVENOUS
  Administered 2017-05-08: 200 mg via INTRAVENOUS

## 2017-05-08 MED ORDER — THROMBIN 20000 UNITS EX SOLR
CUTANEOUS | Status: AC
  Filled 2017-05-08: qty 20000

## 2017-05-08 MED ORDER — DILTIAZEM HCL ER COATED BEADS 120 MG PO CP24
120.0000 mg | ORAL_CAPSULE | Freq: Every day | ORAL | Status: DC
  Administered 2017-05-09: 120 mg via ORAL
  Filled 2017-05-08 (×2): qty 1

## 2017-05-08 MED ORDER — PHENOL 1.4 % MT LIQD: 1.0000 | OROMUCOSAL | Status: DC | PRN

## 2017-05-08 MED ORDER — MEPERIDINE HCL 50 MG/ML IJ SOLN: 6.2500 mg | INTRAMUSCULAR | Status: DC | PRN

## 2017-05-08 MED ORDER — ONDANSETRON HCL 4 MG/2ML IJ SOLN: 4.0000 mg | Freq: Four times a day (QID) | INTRAMUSCULAR | Status: DC | PRN

## 2017-05-08 MED ORDER — HYDROMORPHONE HCL 1 MG/ML IJ SOLN
0.2500 mg | INTRAMUSCULAR | Status: DC | PRN
  Administered 2017-05-08 (×2): 0.5 mg via INTRAVENOUS

## 2017-05-08 MED ORDER — BUPIVACAINE-EPINEPHRINE (PF) 0.5% -1:200000 IJ SOLN
INTRAMUSCULAR | Status: AC
  Filled 2017-05-08: qty 30

## 2017-05-08 MED ORDER — BISACODYL 5 MG PO TBEC: 5.0000 mg | DELAYED_RELEASE_TABLET | Freq: Every day | ORAL | Status: DC | PRN

## 2017-05-08 MED ORDER — METHOCARBAMOL 500 MG PO TABS
500.0000 mg | ORAL_TABLET | Freq: Four times a day (QID) | ORAL | Status: DC | PRN
  Filled 2017-05-08: qty 1

## 2017-05-08 MED ORDER — BUPIVACAINE-EPINEPHRINE 0.5% -1:200000 IJ SOLN
INTRAMUSCULAR | Status: DC | PRN
  Administered 2017-05-08: 11 mL

## 2017-05-08 MED ORDER — LACTATED RINGERS IV SOLN: INTRAVENOUS | Status: DC

## 2017-05-08 MED ORDER — SODIUM CHLORIDE 0.9% FLUSH
3.0000 mL | Freq: Two times a day (BID) | INTRAVENOUS | Status: DC
  Administered 2017-05-08 – 2017-05-09 (×2): 3 mL via INTRAVENOUS

## 2017-05-08 MED ORDER — MIDAZOLAM HCL 5 MG/5ML IJ SOLN
INTRAMUSCULAR | Status: DC | PRN
  Administered 2017-05-08: 2 mg via INTRAVENOUS

## 2017-05-08 MED ORDER — KETOROLAC TROMETHAMINE 30 MG/ML IJ SOLN: 30.0000 mg | Freq: Once | INTRAMUSCULAR | Status: DC | PRN

## 2017-05-08 MED ORDER — DOCUSATE SODIUM 100 MG PO CAPS
100.0000 mg | ORAL_CAPSULE | Freq: Two times a day (BID) | ORAL | Status: DC
  Administered 2017-05-08 – 2017-05-09 (×2): 100 mg via ORAL
  Filled 2017-05-08 (×2): qty 1

## 2017-05-08 MED ORDER — LIDOCAINE HCL (CARDIAC) 20 MG/ML IV SOLN
INTRAVENOUS | Status: AC
  Filled 2017-05-08: qty 5

## 2017-05-08 MED ORDER — LACTATED RINGERS IV SOLN
INTRAVENOUS | Status: DC
  Administered 2017-05-08 (×2): via INTRAVENOUS

## 2017-05-08 MED ORDER — ONDANSETRON HCL 4 MG/2ML IJ SOLN
INTRAMUSCULAR | Status: AC
  Filled 2017-05-08: qty 2

## 2017-05-08 MED ORDER — DEXTROSE-NACL 5-0.45 % IV SOLN: INTRAVENOUS | Status: DC

## 2017-05-08 MED ORDER — LIDOCAINE HCL (CARDIAC) 20 MG/ML IV SOLN
INTRAVENOUS | Status: DC | PRN
  Administered 2017-05-08: 100 mg via INTRAVENOUS

## 2017-05-08 MED ORDER — SUCCINYLCHOLINE CHLORIDE 200 MG/10ML IV SOSY
PREFILLED_SYRINGE | INTRAVENOUS | Status: AC
  Filled 2017-05-08: qty 10

## 2017-05-08 MED ORDER — ONDANSETRON HCL 4 MG/2ML IJ SOLN
INTRAMUSCULAR | Status: DC | PRN
  Administered 2017-05-08: 4 mg via INTRAVENOUS

## 2017-05-08 MED ORDER — PROMETHAZINE HCL 25 MG/ML IJ SOLN: 6.2500 mg | INTRAMUSCULAR | Status: DC | PRN

## 2017-05-08 MED ORDER — HYDROMORPHONE HCL 1 MG/ML IJ SOLN
INTRAMUSCULAR | Status: AC
  Filled 2017-05-08: qty 1

## 2017-05-08 MED ORDER — HYDROMORPHONE HCL 1 MG/ML IJ SOLN: 1.0000 mg | INTRAMUSCULAR | Status: DC | PRN

## 2017-05-08 MED ORDER — ROCURONIUM BROMIDE 100 MG/10ML IV SOLN
INTRAVENOUS | Status: DC | PRN
  Administered 2017-05-08 (×2): 20 mg via INTRAVENOUS
  Administered 2017-05-08: 40 mg via INTRAVENOUS
  Administered 2017-05-08: 20 mg via INTRAVENOUS

## 2017-05-08 SURGICAL SUPPLY — 62 items
BAG DECANTER FOR FLEXI CONT (MISCELLANEOUS) ×3 IMPLANT
CLEANER TIP ELECTROSURG 2X2 (MISCELLANEOUS) ×3 IMPLANT
CLOSURE WOUND 1/2 X4 (GAUZE/BANDAGES/DRESSINGS) ×1
CLOTH 2% CHLOROHEXIDINE 3PK (PERSONAL CARE ITEMS) ×1 IMPLANT
DRAPE LAPAROTOMY 100X72X124 (DRAPES) ×3 IMPLANT
DRAPE MICROSCOPE LEICA (MISCELLANEOUS) ×3 IMPLANT
DRAPE SHEET LG 3/4 BI-LAMINATE (DRAPES) ×3 IMPLANT
DRAPE SURG 17X11 SM STRL (DRAPES) ×3 IMPLANT
DRAPE UTILITY XL STRL (DRAPES) ×3 IMPLANT
DRSG AQUACEL AG ADV 3.5X 4 (GAUZE/BANDAGES/DRESSINGS) IMPLANT
DRSG AQUACEL AG ADV 3.5X 6 (GAUZE/BANDAGES/DRESSINGS) ×2 IMPLANT
DRSG TELFA 3X8 NADH (GAUZE/BANDAGES/DRESSINGS) IMPLANT
DURAPREP 26ML APPLICATOR (WOUND CARE) ×3 IMPLANT
DURASEAL SPINE SEALANT 3ML (MISCELLANEOUS) IMPLANT
ELECT BLADE 4.0 EZ CLEAN MEGAD (MISCELLANEOUS) ×3
ELECTRODE BLDE 4.0 EZ CLN MEGD (MISCELLANEOUS) IMPLANT
FORCEPS BIPOLAR SPETZLER 8 1.0 (NEUROSURGERY SUPPLIES) ×2 IMPLANT
GLOVE BIO SURGEON STRL SZ 6.5 (GLOVE) ×1 IMPLANT
GLOVE BIO SURGEONS STRL SZ 6.5 (GLOVE) ×1
GLOVE BIOGEL PI IND STRL 6.5 (GLOVE) IMPLANT
GLOVE BIOGEL PI IND STRL 7.0 (GLOVE) ×1 IMPLANT
GLOVE BIOGEL PI IND STRL 8 (GLOVE) IMPLANT
GLOVE BIOGEL PI INDICATOR 6.5 (GLOVE) ×2
GLOVE BIOGEL PI INDICATOR 7.0 (GLOVE) ×4
GLOVE BIOGEL PI INDICATOR 8 (GLOVE) ×6
GLOVE ECLIPSE 7.5 STRL STRAW (GLOVE) ×6 IMPLANT
GLOVE SURG SS PI 7.5 STRL IVOR (GLOVE) ×3 IMPLANT
GLOVE SURG SS PI 8.0 STRL IVOR (GLOVE) ×6 IMPLANT
GOWN STRL REUS W/ TWL LRG LVL3 (GOWN DISPOSABLE) ×1 IMPLANT
GOWN STRL REUS W/ TWL XL LVL3 (GOWN DISPOSABLE) ×1 IMPLANT
GOWN STRL REUS W/TWL 2XL LVL3 (GOWN DISPOSABLE) ×4 IMPLANT
GOWN STRL REUS W/TWL LRG LVL3 (GOWN DISPOSABLE) ×9
GOWN STRL REUS W/TWL XL LVL3 (GOWN DISPOSABLE) ×3
IV CATH 14GX2 1/4 (CATHETERS) ×3 IMPLANT
KIT BASIN OR (CUSTOM PROCEDURE TRAY) ×3 IMPLANT
NDL SPNL 18GX3.5 QUINCKE PK (NEEDLE) ×2 IMPLANT
NEEDLE 22X1 1/2 (OR ONLY) (NEEDLE) ×3 IMPLANT
NEEDLE SPNL 18GX3.5 QUINCKE PK (NEEDLE) ×9 IMPLANT
NS IRRIG 1000ML POUR BTL (IV SOLUTION) ×2 IMPLANT
PACK LAMINECTOMY NEURO (CUSTOM PROCEDURE TRAY) ×3 IMPLANT
PAD DRESSING TELFA 3X8 NADH (GAUZE/BANDAGES/DRESSINGS) IMPLANT
PATTIES SURGICAL .75X.75 (GAUZE/BANDAGES/DRESSINGS) IMPLANT
RUBBERBAND STERILE (MISCELLANEOUS) ×6 IMPLANT
SPONGE LAP 4X18 X RAY DECT (DISPOSABLE) ×2 IMPLANT
SPONGE SURGIFOAM ABS GEL 100 (HEMOSTASIS) ×3 IMPLANT
STAPLER VISISTAT (STAPLE) IMPLANT
STRIP CLOSURE SKIN 1/2X4 (GAUZE/BANDAGES/DRESSINGS) ×2 IMPLANT
SUT NURALON 4 0 TR CR/8 (SUTURE) IMPLANT
SUT PROLENE 3 0 PS 2 (SUTURE) IMPLANT
SUT VIC AB 1 CT1 18XBRD ANBCTR (SUTURE) IMPLANT
SUT VIC AB 1 CT1 27 (SUTURE)
SUT VIC AB 1 CT1 27XBRD ANTBC (SUTURE) IMPLANT
SUT VIC AB 1 CT1 8-18 (SUTURE) ×3
SUT VIC AB 1-0 CT2 27 (SUTURE) ×4 IMPLANT
SUT VIC AB 2-0 CT1 27 (SUTURE)
SUT VIC AB 2-0 CT1 TAPERPNT 27 (SUTURE) IMPLANT
SUT VIC AB 2-0 CT2 27 (SUTURE) ×3 IMPLANT
SYR 3ML LL SCALE MARK (SYRINGE) ×3 IMPLANT
TAPE CLOTH SURG 4X10 WHT LF (GAUZE/BANDAGES/DRESSINGS) ×2 IMPLANT
TOWEL GREEN STERILE (TOWEL DISPOSABLE) ×3 IMPLANT
TOWEL GREEN STERILE FF (TOWEL DISPOSABLE) ×3 IMPLANT
YANKAUER SUCT BULB TIP NO VENT (SUCTIONS) ×3 IMPLANT

## 2017-05-08 NOTE — Anesthesia Procedure Notes (Signed)
Procedure Name: Intubation Date/Time: 05/08/2017 11:48 AM Performed by: Fransisca KaufmannMeyer, Murial Beam E, CRNA Pre-anesthesia Checklist: Patient identified, Emergency Drugs available, Suction available and Patient being monitored Patient Re-evaluated:Patient Re-evaluated prior to induction Oxygen Delivery Method: Circle System Utilized Preoxygenation: Pre-oxygenation with 100% oxygen Induction Type: IV induction Ventilation: Mask ventilation without difficulty Laryngoscope Size: Miller and 3 Grade View: Grade III Tube type: Oral Tube size: 7.5 mm Number of attempts: 1 Airway Equipment and Method: Stylet and Oral airway Placement Confirmation: ETT inserted through vocal cords under direct vision,  positive ETCO2 and breath sounds checked- equal and bilateral Secured at: 24 cm Tube secured with: Tape Dental Injury: Teeth and Oropharynx as per pre-operative assessment  Difficulty Due To: Difficult Airway- due to anterior larynx

## 2017-05-08 NOTE — Discharge Instructions (Signed)

## 2017-05-08 NOTE — Transfer of Care (Signed)
Immediate Anesthesia Transfer of Care Note  Patient: Joshua HuaRichard L Allen  Procedure(s) Performed: Microlumbar decompression Lumbar two-three (N/A Back)  Patient Location: PACU  Anesthesia Type:General  Level of Consciousness: awake, alert , oriented and sedated  Airway & Oxygen Therapy: Patient Spontanous Breathing and Patient connected to nasal cannula oxygen  Post-op Assessment: Report given to RN, Post -op Vital signs reviewed and stable and Patient moving all extremities  Post vital signs: Reviewed and stable  Last Vitals:  Vitals Value Taken Time  BP 142/81 05/08/2017  2:34 PM  Temp    Pulse 81 05/08/2017  2:38 PM  Resp 22 05/08/2017  2:35 PM  SpO2 95 % 05/08/2017  2:38 PM  Vitals shown include unvalidated device data.  Last Pain:  Vitals:   05/08/17 0903  TempSrc:   PainSc: 2          Complications: No apparent anesthesia complications

## 2017-05-08 NOTE — Interval H&P Note (Signed)
History and Physical Interval Note:  05/08/2017 10:44 AM  [name]  has presented today for surgery, with the diagnosis of HNP L2-3  The various methods of treatment have been discussed with the patient and family. After consideration of risks, benefits and other options for treatment, the patient has consented to  Procedure(s) with comments: Microlumbar decompression L2-3 (N/A) - 2 hrs as a surgical intervention .  The patient's history has been reviewed, patient examined, no change in status, stable for surgery.  I have reviewed the patient's chart and labs.  Questions were answered to the patient's satisfaction.     Javier Mamone C

## 2017-05-08 NOTE — Progress Notes (Signed)
Pt is on NIV QHS at this time tolerating it well.  

## 2017-05-08 NOTE — Anesthesia Preprocedure Evaluation (Addendum)
Anesthesia Evaluation  Patient identified by MRN, date of birth, ID band Patient awake    Reviewed: Allergy & Precautions, NPO status , Patient's Chart, lab work & pertinent test results  Airway Mallampati: I       Dental no notable dental hx. (+) Poor Dentition, Missing,    Pulmonary sleep apnea ,    Pulmonary exam normal breath sounds clear to auscultation       Cardiovascular hypertension, Pt. on medications Normal cardiovascular exam Rhythm:Regular Rate:Normal     Neuro/Psych PSYCHIATRIC DISORDERS Depression    GI/Hepatic Neg liver ROS,   Endo/Other  negative endocrine ROS  Renal/GU negative Renal ROS  negative genitourinary   Musculoskeletal   Abdominal (+) + obese,   Peds  Hematology negative hematology ROS (+)   Anesthesia Other Findings   Reproductive/Obstetrics                            Anesthesia Physical Anesthesia Plan  ASA: II  Anesthesia Plan: General   Post-op Pain Management:    Induction: Intravenous  PONV Risk Score and Plan: 3  Airway Management Planned: Oral ETT  Additional Equipment:   Intra-op Plan:   Post-operative Plan: Extubation in OR  Informed Consent: I have reviewed the patients History and Physical, chart, labs and discussed the procedure including the risks, benefits and alternatives for the proposed anesthesia with the patient or authorized representative who has indicated his/her understanding and acceptance.   Dental advisory given  Plan Discussed with: CRNA and Surgeon  Anesthesia Plan Comments:         Anesthesia Quick Evaluation

## 2017-05-09 ENCOUNTER — Encounter (HOSPITAL_COMMUNITY): Payer: Self-pay | Admitting: Specialist

## 2017-05-09 DIAGNOSIS — M5116 Intervertebral disc disorders with radiculopathy, lumbar region: Secondary | ICD-10-CM | POA: Diagnosis not present

## 2017-05-09 MED ORDER — DOCUSATE SODIUM 100 MG PO CAPS: 100.0000 mg | ORAL_CAPSULE | Freq: Two times a day (BID) | ORAL | 0 refills | Status: DC

## 2017-05-09 MED ORDER — POLYETHYLENE GLYCOL 3350 17 G PO PACK: 17.0000 g | PACK | Freq: Every day | ORAL | 0 refills | Status: DC | PRN

## 2017-05-09 MED ORDER — GABAPENTIN 300 MG PO CAPS: 300.0000 mg | ORAL_CAPSULE | Freq: Three times a day (TID) | ORAL | 1 refills | Status: DC

## 2017-05-09 MED ORDER — MELOXICAM 7.5 MG PO TABS: 7.5000 mg | ORAL_TABLET | Freq: Every day | ORAL | Status: DC

## 2017-05-09 MED ORDER — ASPIRIN EC 81 MG PO TBEC: 81.0000 mg | DELAYED_RELEASE_TABLET | Freq: Every day | ORAL | Status: DC

## 2017-05-09 MED ORDER — OXYCODONE HCL 5 MG PO TABS: 5.0000 mg | ORAL_TABLET | ORAL | 0 refills | Status: DC | PRN

## 2017-05-09 MED ORDER — IBUPROFEN 200 MG PO TABS: 400.0000 mg | ORAL_TABLET | Freq: Every day | ORAL | 0 refills | Status: DC | PRN

## 2017-05-09 MED ORDER — METHOCARBAMOL 500 MG PO TABS: 500.0000 mg | ORAL_TABLET | Freq: Four times a day (QID) | ORAL | 1 refills | Status: DC | PRN

## 2017-05-09 MED ORDER — TAMSULOSIN HCL 0.4 MG PO CAPS
0.4000 mg | ORAL_CAPSULE | Freq: Every day | ORAL | Status: DC
  Administered 2017-05-09: 0.4 mg via ORAL
  Filled 2017-05-09: qty 1

## 2017-05-09 MED FILL — Thrombin For Soln 20000 Unit: CUTANEOUS | Qty: 1 | Status: AC

## 2017-05-09 NOTE — Progress Notes (Signed)
Subjective: 1 Day Post-Op Procedure(s) (LRB): Microlumbar decompression Lumbar two-three (N/A) Patient reports pain as moderate.   Leg pain resolved. Urinated once post-op for approx 500cc then at 430am required I&O cath as he was unable to urinate on his own. Approx 1,000cc. Denies saddle anesthesia, felt cath I&O. No other c/o.  Objective: Vital signs in last 24 hours: Temp:  [97.6 F (36.4 C)-98.5 F (36.9 C)] 98.2 F (36.8 C) (04/05 0629) Pulse Rate:  [61-79] 64 (04/05 0629) Resp:  [17-33] 19 (04/04 1622) BP: (120-149)/(71-91) 128/79 (04/05 0629) SpO2:  [90 %-98 %] 96 % (04/05 0629) Weight:  [133.4 kg (294 lb)] 133.4 kg (294 lb) (04/04 0840)  Intake/Output from previous day: 04/04 0701 - 04/05 0700 In: 2390 [P.O.:240; I.V.:1800; IV Piggyback:250] Out: 1750 [Urine:1550; Blood:200] Intake/Output this shift: No intake/output data recorded.  No results for input(s): HGB in the last 72 hours. No results for input(s): WBC, RBC, HCT, PLT in the last 72 hours. No results for input(s): NA, K, CL, CO2, BUN, CREATININE, GLUCOSE, CALCIUM in the last 72 hours. No results for input(s): LABPT, INR in the last 72 hours.  Neurologically intact ABD soft Neurovascular intact Sensation intact distally Intact pulses distally Dorsiflexion/Plantar flexion intact Incision: dressing C/D/I and no drainage No cellulitis present Compartment soft no calf pain or sign of DVT. Rectal exam with normal sensation and tone   Assessment/Plan: 1 Day Post-Op Procedure(s) (LRB): Microlumbar decompression Lumbar two-three (N/A) Advance diet Up with therapy D/C IV fluids Flomax Bladder scan by 1030 if does not void (6 h after I&O) and repeat cath if needed May require DC with foley, discussed with pt, with urology follow up if not resolving  Hold narcotics to avoid further urinary retention issues Possible D/C later today if voiding without difficulty, pain well controlled Discussed D/C  instructions Discussed with Dr. Beane  BISSELL, JACLYN M. 05/09/2017, 8:13 AM  

## 2017-05-09 NOTE — H&P (View-Only) (Signed)
Subjective: 1 Day Post-Op Procedure(s) (LRB): Microlumbar decompression Lumbar two-three (N/A) Patient reports pain as moderate.   Leg pain resolved. Urinated once post-op for approx 500cc then at 430am required I&O cath as he was unable to urinate on his own. Approx 1,000cc. Denies saddle anesthesia, felt cath I&O. No other c/o.  Objective: Vital signs in last 24 hours: Temp:  [97.6 F (36.4 C)-98.5 F (36.9 C)] 98.2 F (36.8 C) (04/05 0629) Pulse Rate:  [61-79] 64 (04/05 0629) Resp:  [17-33] 19 (04/04 1622) BP: (120-149)/(71-91) 128/79 (04/05 0629) SpO2:  [90 %-98 %] 96 % (04/05 0629) Weight:  [133.4 kg (294 lb)] 133.4 kg (294 lb) (04/04 0840)  Intake/Output from previous day: 04/04 0701 - 04/05 0700 In: 2390 [P.O.:240; I.V.:1800; IV Piggyback:250] Out: 1750 [Urine:1550; Blood:200] Intake/Output this shift: No intake/output data recorded.  No results for input(s): HGB in the last 72 hours. No results for input(s): WBC, RBC, HCT, PLT in the last 72 hours. No results for input(s): NA, K, CL, CO2, BUN, CREATININE, GLUCOSE, CALCIUM in the last 72 hours. No results for input(s): LABPT, INR in the last 72 hours.  Neurologically intact ABD soft Neurovascular intact Sensation intact distally Intact pulses distally Dorsiflexion/Plantar flexion intact Incision: dressing C/D/I and no drainage No cellulitis present Compartment soft no calf pain or sign of DVT. Rectal exam with normal sensation and tone   Assessment/Plan: 1 Day Post-Op Procedure(s) (LRB): Microlumbar decompression Lumbar two-three (N/A) Advance diet Up with therapy D/C IV fluids Flomax Bladder scan by 1030 if does not void (6 h after I&O) and repeat cath if needed May require DC with foley, discussed with pt, with urology follow up if not resolving  Hold narcotics to avoid further urinary retention issues Possible D/C later today if voiding without difficulty, pain well controlled Discussed D/C  instructions Discussed with Dr. Elissa Lovett, Dayna Barker. 05/09/2017, 8:13 AM

## 2017-05-09 NOTE — Evaluation (Signed)
Physical Therapy Evaluation Patient Details Name: [name] MRN: [id number] DOB: 06/29/1960 Today's Date: 05/09/2017   History of Present Illness  [name] is a 56yo white male who comes to [facility] for microlumbar decompression surgery. Pt reports acute insidious onset leg pain and weakness with difficulty AMB since January 2019, [facility] for limited community distances. PMH: Rt knee surgery x2, GERD, HTN, OSA c CPAP.   Clinical Impression  Pt admitted with above diagnosis. Pt currently with functional limitations due to the deficits listed below (see "PT Problem List"). Upon entry, the patient is received supine in bed, wife present. The pt is awake and agreeable to participate. He reports 6/10 pain which worsens with sitting and more with AMB. He c/o bladder fullness without ability to void, RN aware. The pt is alert and oriented x3, pleasant, conversational, and following simple and multi-step commands consistently. Pt educated on back precautions (BAT), as well as log-roll and hip hinge techniques to facilitate mobility. He requires a moderately elevated surface for mod-I STS transfers, and tolerates 200+ft AMB in spirt of significant pain. Performance of stairs is confidence, calculated, and cautious. Empirically, the patient demonstrates increased risk of recurrent falls AEB gait speed <0.35m/s and forward reach <5".Pt will benefit from skilled PT intervention to increase independence and safety with basic mobility in preparation for discharge to the venue listed below.       Follow Up Recommendations Follow surgeon's recommendation for DC plan and follow-up therapies;Home health PT;Supervision - Intermittent    Equipment Recommendations  Rolling walker with 5" wheels;3in1 (PT)(Tall sized rolling walker )    Recommendations for Other Services       Precautions / Restrictions Precautions Precautions: Back Precaution Booklet Issued: No Precaution Comments: Educated on  "Bending/Arching/Twisting" precuations; educated on hip hinge technique for STS transfers.  Restrictions Weight Bearing Restrictions: No      Mobility  Bed Mobility Overal bed mobility: Needs Assistance Bed Mobility: Rolling;Supine to Sit Rolling: Supervision;Min guard   Supine to sit: Min guard;Supervision     General bed mobility comments: educated on log roll, VC adn tactile cues   Transfers Overall transfer level: Modified independent Equipment used: Rolling walker (2 wheeled) Transfers: Sit to/from Stand Sit to Stand: Modified independent (Device/Increase time);From elevated surface            Ambulation/Gait Ambulation/Gait assistance: Supervision Ambulation Distance (Feet): 260 Feet Assistive device: Rolling walker (2 wheeled) Gait Pattern/deviations: WFL(Within Functional Limits) Gait velocity: 0.56m/s    General Gait Details: significant pain limitations, worsening with distance  Stairs Stairs: Yes Stairs assistance: Supervision Stair Management: Two rails;Alternating pattern;Backwards Number of Stairs: 4 General stair comments: no safety concerns.   Wheelchair Mobility    Modified Rankin (Stroke Patients Only)       Balance Overall balance assessment: Modified Independent;No apparent balance deficits (not formally assessed)                                           Pertinent Vitals/Pain Pain Assessment: 0-10 Pain Score: 7  Pain Location: surgical site; 6/10 in bed, increased to 7/10 seated EOB and AMB  Pain Intervention(s): Limited activity within patient's tolerance;Monitored during session;Premedicated before session;Repositioned    Home Living Family/patient expects to be discharged to:: Private residence Living Arrangements: Spouse/significant other Available Help at Discharge: Family Type of Home: Mobile home Home Access: Stairs to enter Entrance Stairs-Rails: Can reach both  Entrance Stairs-Number of Steps: 6 Home  Layout: One level Home Equipment: Cane - single point      Prior Function Level of Independence: Independent with assistive device(s)         Comments: limited community distances only since onset of back pain, no falls      Hand Dominance        Extremity/Trunk Assessment   Upper Extremity Assessment Upper Extremity Assessment: Defer to OT evaluation    Lower Extremity Assessment Lower Extremity Assessment: Overall WFL for tasks assessed    Cervical / Trunk Assessment Cervical / Trunk Assessment: Normal  Communication   Communication: No difficulties  Cognition Arousal/Alertness: Awake/alert Behavior During Therapy: WFL for tasks assessed/performed Overall Cognitive Status: Within Functional Limits for tasks assessed                                        General Comments      Exercises General Exercises - Lower Extremity Heel Slides: AROM;Both;10 reps   Assessment/Plan    PT Assessment Patient needs continued PT services  PT Problem List Decreased activity tolerance;Decreased balance;Decreased mobility;Pain       PT Treatment Interventions Gait training;Stair training;Functional mobility training;Therapeutic activities;Therapeutic exercise;Patient/family education;Balance training    PT Goals (Current goals can be found in the Care Plan section)  Acute Rehab PT Goals Patient Stated Goal: regain independent mobility  PT Goal Formulation: With patient Time For Goal Achievement: 05/16/17 Potential to Achieve Goals: Good    Frequency 7X/week   Barriers to discharge        Co-evaluation               AM-PAC PT "6 Clicks" Daily Activity  Outcome Measure Difficulty turning over in bed (including adjusting bedclothes, sheets and blankets)?: A Lot Difficulty moving from lying on back to sitting on the side of the bed? : A Little Difficulty sitting down on and standing up from a chair with arms (e.g., wheelchair, bedside commode,  etc,.)?: A Little Help needed moving to and from a bed to chair (including a wheelchair)?: A Little Help needed walking in hospital room?: A Little Help needed climbing 3-5 steps with a railing? : A Little 6 Click Score: 17    End of Session Equipment Utilized During Treatment: Gait belt Activity Tolerance: Patient limited by pain;Patient tolerated treatment well Patient left: in chair;with call bell/phone within reach;with family/visitor present Nurse Communication: Mobility status PT Visit Diagnosis: Difficulty in walking, not elsewhere classified (R26.2);Pain;Other abnormalities of gait and mobility (R26.89);Unsteadiness on feet (R26.81) Pain - part of body: (back )    Time: [id number] PT Time Calculation (min) (ACUTE ONLY): 33 min   Charges:   PT Evaluation $PT Eval Moderate Complexity: 1 Mod PT Treatments $Therapeutic Activity: 8-22 mins   PT G Codes:        11:31 AM, 05/09/17 Rosamaria LintsAllan C Jasmeen Fritsch, PT, DPT Physical Therapist - [place] [phone] (Pager)  [phone] (Office)     Kentrell Guettler C 05/09/2017, 11:27 AM

## 2017-05-09 NOTE — Progress Notes (Signed)
Discharge instructions (including medications) discussed with and copy provided to patient/caregiver 

## 2017-05-10 NOTE — Brief Op Note (Signed)
05/08/2017  8:09 AM  PATIENT:  Marisa Huaichard L Cassata  57 y.o. male  PRE-OPERATIVE DIAGNOSIS:  Herniated Nucleus Pulposus Lumbar two-three  POST-OPERATIVE DIAGNOSIS:  Herniated Nucleus Pulposus Lumbar two-three  PROCEDURE:  Procedure(s): Microlumbar decompression Lumbar two-three (N/A)  SURGEON:  Surgeon(s) and Role:    Jene Every* Tashi Band, MD - Primary  PHYSICIAN ASSISTANT:   ASSISTANTS:  Bissell  ANESTHESIA:  GET  EBL:  200 mL   BLOOD ADMINISTERED   none DRAINS: none   LOCAL MEDICATIONS USED:  marcaine  SPECIMEN:  noneeDISPOSITION OF SPECIMEN:  none  COUNTS:  correct  TOURNIQUET:  * No tourniquets in log *  DICTATIO: [id number]  PLAN OF CARE: admit  PATIENT DISPOSITION: good   Delay start of Pharmacological VTE agent (>24hrs) due to surgical blood loss or risk of bleeding: yes

## 2017-05-12 NOTE — Op Note (Signed)
NAME:  [name], [name]                    ACCOUNT NO.:  MEDICAL RECORD NO.:  [id number]  LOCATION:                                 FACILITY:  PHYSICIAN:  Jene EveryJeffrey Mychaela Lennartz, M.D.    DATE OF BIRTH:  1960-08-25  DATE OF PROCEDURE:  05/07/2017 DATE OF DISCHARGE:                              OPERATIVE REPORT   PREOPERATIVE DIAGNOSES: 1. Spinal stenosis, herniated nucleus pulposus, L2-L3. 2. Elevated BMI of 37.  POSTOPERATIVE DIAGNOSES: 1. Spinal stenosis, herniated nucleus pulposus, L2-L3. 2. Elevated BMI of 37.  PROCEDURES PERFORMED: 1. Microlumbar decompression, L2-L3, with bilateral L2-L3     foraminotomies. 2. Microdiskectomy, L2-L3.  TECHNICAL DIFFICULTY:  Elevated due to the patient's elevated BMI.  HISTORY:  A 57 year old male with disk herniation, scoliosis, and spinal stenosis compressing the L3 nerve root due to a disk herniation at L2- L3.  It was indicated for decompression of the L3 and L2 nerve roots by microlumbar decompression.  His scoliosis and L2-L3 __________ access optimized by central decompression.  The risks and benefits were discussed including bleeding, infection, damage to neurovascular structures, no change in symptoms, worsening symptoms, DVT, PE, anesthetic complications, etc.  TECHNIQUE:  With the patient in supine position after the induction of adequate general anesthesia and 3 g of Kefzol, he was placed prone on the Wilson frame [place] table.  All bony prominences were well padded.  The lumbar region was prepped and draped in the usual sterile fashion. Two 18-gauge spinal needles were localized.  L2-L3 interspace was confirmed with x-ray.  Incision was made from above the spinous process of L2 to below L3.  Subcutaneous tissue was dissected ample. Electrocautery was utilized to achieve hemostasis.  Dorsolumbar fascia was divided in line with the skin incision.  Paraspinous muscles were elevated after infiltration with 0.25% Marcaine with  epinephrine for hemostasis.  McCullough retractor was placed.  Scope was replaced in spinous process of L2 and L3 and was confirmed.  The disk space was above the interlaminar space.  Therefore, removed a portion of the spinous process up to the Leksell rongeur.  The patient had generalized bleeding throughout the case.  We used thrombin-soaked Gelfoam and bone wax and electrocautery.  Following this, we used a microcurette to detach the ligamentum flavum from the cephalad edge of L3.  This was after removing the interspinous ligament in the portion of the spinous process of L3.  There is disk herniation with __________. Hemilaminotomies of the caudad edge of L3 performed bilaterally to the point of removing the ligamentum flavum and also gain an access to the disk space.  Bilateral hemilaminotomies were performed at L3 as well. Neuro patties placed beneath the ligamentum flavum which were __________.  We removed the ligamentum flavum from the interspace with mobilization of the thecal sac was possible.  We decompressed the lateral recess on the left.  We then turned our attention.  He had a tethering epidural venous plexus on the right.  After decompressing the lateral recess to the medial pedicle and preserving the pars, gently mobilizing the thecal sac __________ and a Woodson retractor.  Bipolar electrocautery was utilized to achieve the hemostasis.  __________  foraminotomy of L2, we were able to gain access to the lateral aspect of the disk and thecal sac.  Following this, __________ was severely compressed on the right just prior to the decompression.  I identified the disk which was prominent lateral to the thecal sac.  To protect the L2 and the L3 nerve roots, I performed an annulotomy and a nerve hook to remove the disk herniation __________.  Majority of it was an osteophyte complex, however, on the posterior aspect of the vertebral body.  After copious irrigation with  antibiotic irrigation, a neural probe was passed freely up to the foramen of L2 and L3 and above the pedicle of L2 and below L3.  No evidence of CSF leakage or active bleeding.  Confirmatory radiograph was obtained.  Following this, again, bone wax was placed on the cancellous surfaces.  Thrombin-soaked Gelfoam was placed in the laminotomy defect.  We removed the McCullough retractor and meticulously achieved hemostasis after copious irrigation.  I then closed the __________.  It was also stenotic on the left as well.  I then closed the dorsolumbar fascia with 1 Vicryl interrupted figure-of- eight sutures and subcu with multiple layers of 2-0 and the skin with staples.  Dressed sterilely and was placed supine on the hospital bed, extubated without difficulty, and transported to the recovery room in satisfactory condition.  The patient tolerated the procedure well.  COMPLICATIONS:  No complications.  ASSISTANT:  Andrez Grime, PA.  ESTIMATED BLOOD LOSS:  200 mL.  SPECIMEN:  No specimen.     Jene Every, M.D.     Cordelia Pen  D:  05/10/2017  T:  05/10/2017  Job:  [id number]

## 2017-05-12 NOTE — Discharge Summary (Signed)
Physician Discharge Summary   Patient ID: [name] MRN: [id number] DOB/AGE: 55-Dec-1962 55 y.o.  Admit date: 05/08/2017 Discharge date: 05/12/2017  Primary Diagnosis:   Herniated Nucleus Pulposus Lumbar two-three  Admission Diagnoses:  Past Medical History:  Diagnosis Date  . Arthritis    "right knee"  . Depression   . GERD (gastroesophageal reflux disease)   . High cholesterol   . History of kidney stones    "33 years ago"  . Hypertension   . Irregular heart rhythm   . Pneumonia   . Sleep apnea    wears CPAP   Discharge Diagnoses:   Principal Problem:   HNP (herniated nucleus pulposus), lumbar  Procedure:  Procedure(s) (LRB): Microlumbar decompression Lumbar two-three (N/A)   Consults: None  HPI:  see H&P    Laboratory Data: Hospital Outpatient Visit on 04/30/2017  Component Date Value Ref Range Status  . MRSA, PCR 04/30/2017 NEGATIVE  NEGATIVE Final  . Staphylococcus aureus 04/30/2017 POSITIVE* NEGATIVE Final   Comment: (NOTE) The Xpert SA Assay (FDA approved for NASAL specimens in patients 55 years of age and older), is one component of a comprehensive surveillance program. It is not intended to diagnose infection nor to guide or monitor treatment. Performed at [facility], [place] [address], [place], [place] [address]   . Sodium 04/30/2017 138  135 - 145 mmol/L Final  . Potassium 04/30/2017 4.4  3.5 - 5.1 mmol/L Final  . Chloride 04/30/2017 107  101 - 111 mmol/L Final  . CO2 04/30/2017 20* 22 - 32 mmol/L Final  . Glucose, Bld 04/30/2017 100* 65 - 99 mg/dL Final  . BUN 04/30/2017 12  6 - 20 mg/dL Final  . Creatinine, Ser 04/30/2017 1.02  0.61 - 1.24 mg/dL Final  . Calcium 04/30/2017 9.3  8.9 - 10.3 mg/dL Final  . GFR calc non Af Amer 04/30/2017 >60  >60 mL/min Final  . GFR calc Af Amer 04/30/2017 >60  >60 mL/min Final   Comment: (NOTE) The eGFR has been calculated using the CKD EPI equation. This calculation has not been validated in all  clinical situations. eGFR's persistently <60 mL/min signify possible Chronic Kidney Disease.   Georgiann Hahn gap 04/30/2017 11  5 - 15 Final   Performed at [facility], [place] [address] [place], [place] [address]  . WBC 04/30/2017 7.1  4.0 - 10.5 K/uL Final  . RBC 04/30/2017 4.98  4.22 - 5.81 MIL/uL Final  . Hemoglobin 04/30/2017 15.0  13.0 - 17.0 g/dL Final  . HCT 04/30/2017 43.8  39.0 - 52.0 % Final  . MCV 04/30/2017 88.0  78.0 - 100.0 fL Final  . MCH 04/30/2017 30.1  26.0 - 34.0 pg Final  . MCHC 04/30/2017 34.2  30.0 - 36.0 g/dL Final  . RDW 04/30/2017 13.6  11.5 - 15.5 % Final  . Platelets 04/30/2017 191  150 - 400 K/uL Final   Performed at [facility], [place] [address] [place],  [address]   No results for input(s): HGB in the last 72 hours. No results for input(s): WBC, RBC, HCT, PLT in the last 72 hours. No results for input(s): NA, K, CL, CO2, BUN, CREATININE, GLUCOSE, CALCIUM in the last 72 hours. No results for input(s): LABPT, INR in the last 72 hours.  X-Rays:Dg Lumbar Spine 2-3 Views  Result Date: 05/08/2017 CLINICAL DATA:  Intraoperative localization EXAM: LUMBAR SPINE - 2-3 VIEW COMPARISON:  04/30/2017 FINDINGS: Three images were obtained intraoperatively during lumbar surgery.  The initial image shows needles within the posterior soft tissues at the level of the L2 and L3 spinous processes. Subsequent image demonstrates surgical retractors with instruments at the L2-3 and L3-4 level. Subsequent image demonstrates surgical instrument within the spinal canal just above the L2-3 interspace. IMPRESSION: Intraoperative lumbar localization as described. Electronically Signed   By: Inez Catalina M.D.   On: 05/08/2017 14:40   Dg Lumbar Spine 2-3 Views  Result Date: 04/30/2017 CLINICAL DATA:  Degenerative disc disease. EXAM: LUMBAR SPINE - 2-3 VIEW COMPARISON:  Lumbar spine x-rays dated February 12, 2017. FINDINGS: Five lumbar type vertebral bodies. No acute fracture or  subluxation. Vertebral body heights are preserved. Slightly increased grade 1 anterolisthesis at L5-S1 due to bilateral L5 pars defects. Moderate disc height loss at L5-S1, unchanged. Mild disc height loss at L1-L2 and L2-L3. The sacroiliac joints are intact. IMPRESSION: 1. Slightly increased grade 1 anterolisthesis at L5-S1 due to bilateral L5 pars defects. 2. Moderate degenerative disc disease at L5-S1, unchanged. Electronically Signed   By: Titus Dubin M.D.   On: 04/30/2017 16:36    EKG: Orders placed or performed during the hospital encounter of 04/30/17  . EKG 12 lead  . EKG 12 lead     Hospital Course: Patient was admitted to [facility] and taken to the OR and underwent the above state procedure without complications.  Patient tolerated the procedure well and was later transferred to the recovery room and then to the orthopaedic floor for postoperative care.  They were given PO and IV analgesics for pain control following their surgery.  They were given 24 hours of postoperative antibiotics.   PT was consulted postop to assist with mobility and transfers.  The patient was allowed to be WBAT with therapy and was taught back precautions. Discharge planning was consulted to help with postop disposition and equipment needs.  Patient had a fair night on the evening of surgery and started to get up OOB with therapy on day one. He did experience some urinary retention starting early AM of POD 1. This improved with flomax, limitation of narcotics, and s/p I&O cath. Patient was seen in rounds and was ready to go home on day one.  They were given discharge instructions and dressing directions.  They were instructed on when to follow up in the office with Dr. Tonita Cong.   Diet: Regular diet Activity:WBAT; Lspine precautions Follow-up:in 10-14 days Disposition - Home Discharged Condition: good   Discharge Instructions    Call MD / Call 911   Complete by:  As directed    If you experience  chest pain or shortness of breath, CALL 911 and be transported to the hospital emergency room.  If you develope a fever above 101 F, pus (white drainage) or increased drainage or redness at the wound, or calf pain, call your surgeon's office.   Constipation Prevention   Complete by:  As directed    Drink plenty of fluids.  Prune juice may be helpful.  You may use a stool softener, such as Colace (over the counter) 100 mg twice a day.  Use MiraLax (over the counter) for constipation as needed.   Diet - low sodium heart healthy   Complete by:  As directed    Increase activity slowly as tolerated   Complete by:  As directed      Allergies as of 05/09/2017      Reactions   Iodinated Diagnostic Agents Palpitations, Hypertension   Iohexol Palpitations, Hypertension  Increased blood pressure       Medication List    STOP taking these medications   cyclobenzaprine 10 MG tablet Commonly known as:  FLEXERIL     TAKE these medications   aspirin EC 81 MG tablet Take 1 tablet (81 mg total) by mouth daily. Resume 4 days post-op What changed:  additional instructions   bismuth subsalicylate 574 VT/55EZ suspension Commonly known as:  PEPTO BISMOL Take 30 mLs by mouth daily as needed for indigestion or diarrhea or loose stools.   diltiazem 120 MG 24 hr capsule Commonly known as:  CARDIZEM CD Take 1 capsule (120 mg total) by mouth daily.   docusate sodium 100 MG capsule Commonly known as:  COLACE Take 1 capsule (100 mg total) by mouth 2 (two) times daily.   escitalopram 20 MG tablet Commonly known as:  LEXAPRO Take 20 mg by mouth daily.   gabapentin 300 MG capsule Commonly known as:  NEURONTIN Take 1 capsule (300 mg total) by mouth 3 (three) times daily. What changed:  when to take this   ibuprofen 200 MG tablet Commonly known as:  ADVIL,MOTRIN Take 2 tablets (400 mg total) by mouth daily as needed. Resume 5 days post-op as needed What changed:  additional instructions   meloxicam  7.5 MG tablet Commonly known as:  MOBIC Take 1 tablet (7.5 mg total) by mouth daily. Resume 5 days post-op as needed. Do not combine with other Anti-inflammatories What changed:  additional instructions   methocarbamol 500 MG tablet Commonly known as:  ROBAXIN Take 1 tablet (500 mg total) by mouth every 6 (six) hours as needed for muscle spasms.   multivitamin with minerals Tabs tablet Take 1 tablet by mouth daily.   Na Sulfate-K Sulfate-Mg Sulf 17.5-3.13-1.6 GM/177ML Soln Commonly known as:  SUPREP BOWEL PREP KIT Take 1 kit by mouth as directed.   oxyCODONE 5 MG immediate release tablet Commonly known as:  ROXICODONE Take 1-2 tablets (5-10 mg total) by mouth every 4 (four) hours as needed.   polyethylene glycol packet Commonly known as:  MIRALAX / GLYCOLAX Take 17 g by mouth daily as needed for mild constipation.   polyethylene glycol-electrolytes 420 g solution Commonly known as:  TRILYTE Take 4,000 mLs by mouth as directed.   quinapril 20 MG tablet Commonly known as:  ACCUPRIL Take 20 mg by mouth daily.      Follow-up Information    Susa Day, MD Follow up in 2 week(s).   Specialty:  Orthopedic Surgery Contact information: [address] [place] [address] [phone]           Signed: Lacie Draft PA-C Orthopaedic Surgery 05/12/2017, 8:20 AM

## 2017-05-14 NOTE — Anesthesia Postprocedure Evaluation (Signed)
Anesthesia Post Note  Patient: [name]  Procedure(s) Performed: Microlumbar decompression Lumbar two-three (N/A Back)     Patient location during evaluation: PACU Anesthesia Type: General Level of consciousness: awake and sedated Pain management: pain level controlled Vital Signs Assessment: post-procedure vital signs reviewed and stable Respiratory status: spontaneous breathing Cardiovascular status: stable Postop Assessment: no apparent nausea or vomiting Anesthetic complications: no    Last Vitals:  Vitals:   05/09/17 0629 05/09/17 1112  BP: 128/79   Pulse: 64 65  Resp:    Temp: 36.8 C   SpO2: 96% 91%    Last Pain:  Vitals:   05/09/17 0629  TempSrc: Oral  PainSc:    Pain Goal:                 [name],[name]

## 2017-05-16 ENCOUNTER — Other Ambulatory Visit: Payer: Self-pay

## 2017-05-16 ENCOUNTER — Inpatient Hospital Stay (HOSPITAL_COMMUNITY): Payer: No Typology Code available for payment source | Admitting: Certified Registered Nurse Anesthetist

## 2017-05-16 ENCOUNTER — Inpatient Hospital Stay (HOSPITAL_COMMUNITY)
Admission: AD | Admit: 2017-05-16 | Discharge: 2017-05-18 | DRG: 520 | Disposition: A | Discharge status: Home / Self Care | Payer: No Typology Code available for payment source | Source: Ambulatory Visit | Attending: Specialist | Admitting: Specialist

## 2017-05-16 ENCOUNTER — Encounter (HOSPITAL_COMMUNITY)
Admission: AD | Disposition: A | Discharge status: Home / Self Care | Payer: Self-pay | Source: Ambulatory Visit | Attending: Specialist

## 2017-05-16 ENCOUNTER — Inpatient Hospital Stay (HOSPITAL_COMMUNITY): Payer: No Typology Code available for payment source

## 2017-05-16 DIAGNOSIS — Z91041 Radiographic dye allergy status: Secondary | ICD-10-CM | POA: Diagnosis not present

## 2017-05-16 DIAGNOSIS — M1711 Unilateral primary osteoarthritis, right knee: Secondary | ICD-10-CM | POA: Diagnosis present

## 2017-05-16 DIAGNOSIS — Z833 Family history of diabetes mellitus: Secondary | ICD-10-CM | POA: Diagnosis not present

## 2017-05-16 DIAGNOSIS — Z7982 Long term (current) use of aspirin: Secondary | ICD-10-CM | POA: Diagnosis not present

## 2017-05-16 DIAGNOSIS — Z79899 Other long term (current) drug therapy: Secondary | ICD-10-CM | POA: Diagnosis not present

## 2017-05-16 DIAGNOSIS — I10 Essential (primary) hypertension: Secondary | ICD-10-CM | POA: Diagnosis present

## 2017-05-16 DIAGNOSIS — Z8249 Family history of ischemic heart disease and other diseases of the circulatory system: Secondary | ICD-10-CM

## 2017-05-16 DIAGNOSIS — Z888 Allergy status to other drugs, medicaments and biological substances status: Secondary | ICD-10-CM

## 2017-05-16 DIAGNOSIS — M5116 Intervertebral disc disorders with radiculopathy, lumbar region: Principal | ICD-10-CM | POA: Diagnosis present

## 2017-05-16 DIAGNOSIS — Z09 Encounter for follow-up examination after completed treatment for conditions other than malignant neoplasm: Secondary | ICD-10-CM

## 2017-05-16 DIAGNOSIS — K219 Gastro-esophageal reflux disease without esophagitis: Secondary | ICD-10-CM | POA: Diagnosis present

## 2017-05-16 DIAGNOSIS — Z87442 Personal history of urinary calculi: Secondary | ICD-10-CM | POA: Diagnosis not present

## 2017-05-16 DIAGNOSIS — E7800 Pure hypercholesterolemia, unspecified: Secondary | ICD-10-CM | POA: Diagnosis present

## 2017-05-16 DIAGNOSIS — Z79891 Long term (current) use of opiate analgesic: Secondary | ICD-10-CM

## 2017-05-16 DIAGNOSIS — M48061 Spinal stenosis, lumbar region without neurogenic claudication: Secondary | ICD-10-CM | POA: Diagnosis present

## 2017-05-16 DIAGNOSIS — G4730 Sleep apnea, unspecified: Secondary | ICD-10-CM | POA: Diagnosis present

## 2017-05-16 DIAGNOSIS — M5126 Other intervertebral disc displacement, lumbar region: Secondary | ICD-10-CM | POA: Diagnosis present

## 2017-05-16 DIAGNOSIS — Z791 Long term (current) use of non-steroidal anti-inflammatories (NSAID): Secondary | ICD-10-CM

## 2017-05-16 HISTORY — PX: LUMBAR LAMINECTOMY/DECOMPRESSION MICRODISCECTOMY: SHX5026

## 2017-05-16 LAB — BASIC METABOLIC PANEL
ANION GAP: 10 (ref 5–15)
BUN: 16 mg/dL (ref 6–20)
CHLORIDE: 104 mmol/L (ref 101–111)
CO2: 24 mmol/L (ref 22–32)
Calcium: 9.2 mg/dL (ref 8.9–10.3)
Creatinine, Ser: 1.13 mg/dL (ref 0.61–1.24)
GFR calc Af Amer: >60 mL/min (ref >60)
Glucose, Bld: 105 mg/dL — ABNORMAL HIGH (ref 65–99)
POTASSIUM: 4.1 mmol/L (ref 3.5–5.1)
SODIUM: 138 mmol/L (ref 135–145)

## 2017-05-16 LAB — PROTIME-INR
INR: 0.99
Prothrombin Time: 13 seconds (ref 11.4–15.2)

## 2017-05-16 LAB — CBC WITH DIFFERENTIAL/PLATELET
BASOS ABS: 0 10*3/uL (ref 0.0–0.1)
Basophils Relative: 1 %
Eosinophils Absolute: 0.3 10*3/uL (ref 0.0–0.7)
Eosinophils Relative: 4 %
HEMATOCRIT: 43.1 % (ref 39.0–52.0)
Hemoglobin: 14.8 g/dL (ref 13.0–17.0)
LYMPHS ABS: 2.8 10*3/uL (ref 0.7–4.0)
LYMPHS PCT: 36 %
MCH: 30.4 pg (ref 26.0–34.0)
MCHC: 34.3 g/dL (ref 30.0–36.0)
MCV: 88.5 fL (ref 78.0–100.0)
Monocytes Absolute: 0.8 10*3/uL (ref 0.1–1.0)
Monocytes Relative: 10 %
NEUTROS ABS: 3.8 10*3/uL (ref 1.7–7.7)
Neutrophils Relative %: 49 %
Platelets: 263 10*3/uL (ref 150–400)
RBC: 4.87 MIL/uL (ref 4.22–5.81)
RDW: 13.2 % (ref 11.5–15.5)
WBC: 7.6 10*3/uL (ref 4.0–10.5)

## 2017-05-16 LAB — TYPE AND SCREEN
ABO/RH(D): O NEG
Antibody Screen: NEGATIVE

## 2017-05-16 LAB — ABO/RH: ABO/RH(D): O NEG

## 2017-05-16 LAB — APTT: aPTT: 31 seconds (ref 24–36)

## 2017-05-16 SURGERY — LUMBAR LAMINECTOMY/DECOMPRESSION MICRODISCECTOMY
Anesthesia: General | Site: Back

## 2017-05-16 MED ORDER — ONDANSETRON HCL 4 MG PO TABS: 4.0000 mg | ORAL_TABLET | Freq: Four times a day (QID) | ORAL | Status: DC | PRN

## 2017-05-16 MED ORDER — FENTANYL CITRATE (PF) 100 MCG/2ML IJ SOLN: 25.0000 ug | INTRAMUSCULAR | Status: DC | PRN

## 2017-05-16 MED ORDER — DOCUSATE SODIUM 100 MG PO CAPS: 100.0000 mg | ORAL_CAPSULE | Freq: Two times a day (BID) | ORAL | Status: DC

## 2017-05-16 MED ORDER — DILTIAZEM HCL ER COATED BEADS 120 MG PO CP24
120.0000 mg | ORAL_CAPSULE | Freq: Every day | ORAL | Status: DC
  Administered 2017-05-17 – 2017-05-18 (×2): 120 mg via ORAL
  Filled 2017-05-16 (×2): qty 1

## 2017-05-16 MED ORDER — OXYCODONE HCL 5 MG PO TABS
5.0000 mg | ORAL_TABLET | ORAL | Status: DC | PRN
  Administered 2017-05-17 (×2): 5 mg via ORAL
  Filled 2017-05-16 (×2): qty 1

## 2017-05-16 MED ORDER — CEFAZOLIN SODIUM 10 G IJ SOLR
3.0000 g | Freq: Three times a day (TID) | INTRAMUSCULAR | Status: AC
  Administered 2017-05-17 (×3): 3 g via INTRAVENOUS
  Filled 2017-05-16 (×3): qty 3

## 2017-05-16 MED ORDER — SODIUM CHLORIDE 0.9 % IV SOLN
INTRAVENOUS | Status: AC
  Filled 2017-05-16: qty 500000

## 2017-05-16 MED ORDER — DEXAMETHASONE SODIUM PHOSPHATE 10 MG/ML IJ SOLN: 10.0000 mg | Freq: Once | INTRAMUSCULAR | Status: DC

## 2017-05-16 MED ORDER — SUGAMMADEX SODIUM 500 MG/5ML IV SOLN
INTRAVENOUS | Status: AC
  Filled 2017-05-16: qty 5

## 2017-05-16 MED ORDER — METHOCARBAMOL 500 MG PO TABS
500.0000 mg | ORAL_TABLET | Freq: Four times a day (QID) | ORAL | Status: DC | PRN
  Administered 2017-05-17: 500 mg via ORAL
  Filled 2017-05-16: qty 1

## 2017-05-16 MED ORDER — KCL IN DEXTROSE-NACL 20-5-0.45 MEQ/L-%-% IV SOLN
INTRAVENOUS | Status: AC
  Administered 2017-05-17: 01:00:00 via INTRAVENOUS
  Filled 2017-05-16 (×2): qty 1000

## 2017-05-16 MED ORDER — SODIUM CHLORIDE 0.9% FLUSH: 3.0000 mL | Freq: Two times a day (BID) | INTRAVENOUS | Status: DC

## 2017-05-16 MED ORDER — MIDAZOLAM HCL 2 MG/2ML IJ SOLN
INTRAMUSCULAR | Status: AC
  Filled 2017-05-16: qty 2

## 2017-05-16 MED ORDER — LIDOCAINE HCL (PF) 1 % IJ SOLN
INTRAMUSCULAR | Status: AC
  Filled 2017-05-16: qty 30

## 2017-05-16 MED ORDER — PROPOFOL 10 MG/ML IV BOLUS
INTRAVENOUS | Status: DC | PRN
  Administered 2017-05-16: 200 mg via INTRAVENOUS

## 2017-05-16 MED ORDER — LORAZEPAM 0.5 MG PO TABS: 0.5000 mg | ORAL_TABLET | Freq: Four times a day (QID) | ORAL | Status: DC | PRN

## 2017-05-16 MED ORDER — HYDROMORPHONE HCL 1 MG/ML IJ SOLN
INTRAMUSCULAR | Status: AC
  Filled 2017-05-16: qty 1

## 2017-05-16 MED ORDER — ONDANSETRON HCL 4 MG/2ML IJ SOLN
INTRAMUSCULAR | Status: AC
  Filled 2017-05-16: qty 2

## 2017-05-16 MED ORDER — BUPIVACAINE-EPINEPHRINE (PF) 0.5% -1:200000 IJ SOLN
INTRAMUSCULAR | Status: AC
  Filled 2017-05-16: qty 30

## 2017-05-16 MED ORDER — FENTANYL CITRATE (PF) 100 MCG/2ML IJ SOLN
INTRAMUSCULAR | Status: DC | PRN
  Administered 2017-05-16: 100 ug via INTRAVENOUS
  Administered 2017-05-16 (×4): 50 ug via INTRAVENOUS
  Administered 2017-05-16: 100 ug via INTRAVENOUS

## 2017-05-16 MED ORDER — ONDANSETRON HCL 4 MG/2ML IJ SOLN: 4.0000 mg | Freq: Four times a day (QID) | INTRAMUSCULAR | Status: DC | PRN

## 2017-05-16 MED ORDER — ASPIRIN EC 81 MG PO TBEC: 81.0000 mg | DELAYED_RELEASE_TABLET | Freq: Every day | ORAL | Status: AC

## 2017-05-16 MED ORDER — MENTHOL 3 MG MT LOZG: 1.0000 | LOZENGE | OROMUCOSAL | Status: DC | PRN

## 2017-05-16 MED ORDER — FENTANYL CITRATE (PF) 250 MCG/5ML IJ SOLN
INTRAMUSCULAR | Status: AC
  Filled 2017-05-16: qty 5

## 2017-05-16 MED ORDER — METHOCARBAMOL 1000 MG/10ML IJ SOLN
500.0000 mg | Freq: Four times a day (QID) | INTRAMUSCULAR | Status: DC | PRN
Start: 2017-05-16 — End: 2017-05-18
  Administered 2017-05-16: 500 mg via INTRAVENOUS
  Filled 2017-05-16: qty 550
  Filled 2017-05-16: qty 5

## 2017-05-16 MED ORDER — OXYCODONE HCL 5 MG PO TABS
10.0000 mg | ORAL_TABLET | ORAL | Status: DC | PRN
  Administered 2017-05-17: 10 mg via ORAL
  Filled 2017-05-16: qty 2

## 2017-05-16 MED ORDER — SODIUM CHLORIDE 0.9 % IV SOLN
INTRAVENOUS | Status: DC | PRN
  Administered 2017-05-16: 500 mL

## 2017-05-16 MED ORDER — DEXAMETHASONE SODIUM PHOSPHATE 4 MG/ML IJ SOLN
INTRAMUSCULAR | Status: DC | PRN
  Administered 2017-05-16: 10 mg via INTRAVENOUS

## 2017-05-16 MED ORDER — LIDOCAINE 2% (20 MG/ML) 5 ML SYRINGE
INTRAMUSCULAR | Status: DC | PRN
  Administered 2017-05-16: 80 mg via INTRAVENOUS

## 2017-05-16 MED ORDER — TAMSULOSIN HCL 0.4 MG PO CAPS
0.4000 mg | ORAL_CAPSULE | Freq: Every day | ORAL | Status: DC
Start: 2017-05-17 — End: 2017-08-27

## 2017-05-16 MED ORDER — SODIUM CHLORIDE 0.9% FLUSH: 3.0000 mL | INTRAVENOUS | Status: DC | PRN

## 2017-05-16 MED ORDER — POLYETHYLENE GLYCOL 3350 17 G PO PACK: 17.0000 g | PACK | Freq: Every day | ORAL | Status: DC | PRN

## 2017-05-16 MED ORDER — THROMBIN (RECOMBINANT) 5000 UNITS EX SOLR
CUTANEOUS | Status: DC | PRN
  Administered 2017-05-16: 10000 [IU] via TOPICAL

## 2017-05-16 MED ORDER — CEFAZOLIN SODIUM 10 G IJ SOLR: 3.0000 g | INTRAMUSCULAR | Status: DC

## 2017-05-16 MED ORDER — LORAZEPAM 2 MG/ML IJ SOLN: 0.5000 mg | Freq: Four times a day (QID) | INTRAMUSCULAR | Status: DC | PRN

## 2017-05-16 MED ORDER — ALUM & MAG HYDROXIDE-SIMETH 200-200-20 MG/5ML PO SUSP: 30.0000 mL | Freq: Four times a day (QID) | ORAL | Status: DC | PRN

## 2017-05-16 MED ORDER — HYDROMORPHONE HCL 1 MG/ML IJ SOLN: 0.5000 mg | INTRAMUSCULAR | Status: DC | PRN

## 2017-05-16 MED ORDER — PROPOFOL 10 MG/ML IV BOLUS
INTRAVENOUS | Status: AC
  Filled 2017-05-16: qty 40

## 2017-05-16 MED ORDER — MIDAZOLAM HCL 5 MG/5ML IJ SOLN
INTRAMUSCULAR | Status: DC | PRN
  Administered 2017-05-16: 2 mg via INTRAVENOUS

## 2017-05-16 MED ORDER — BISACODYL 5 MG PO TBEC: 5.0000 mg | DELAYED_RELEASE_TABLET | Freq: Every day | ORAL | Status: DC | PRN

## 2017-05-16 MED ORDER — ROCURONIUM BROMIDE 10 MG/ML (PF) SYRINGE
PREFILLED_SYRINGE | INTRAVENOUS | Status: DC | PRN
  Administered 2017-05-16: 10 mg via INTRAVENOUS
  Administered 2017-05-16: 20 mg via INTRAVENOUS
  Administered 2017-05-16: 60 mg via INTRAVENOUS

## 2017-05-16 MED ORDER — DEXTROSE 5 % IV SOLN
3.0000 g | INTRAVENOUS | Status: AC
  Administered 2017-05-16: 3 g via INTRAVENOUS
  Filled 2017-05-16: qty 3000

## 2017-05-16 MED ORDER — LISINOPRIL 20 MG PO TABS
20.0000 mg | ORAL_TABLET | Freq: Every day | ORAL | Status: DC
  Administered 2017-05-17 – 2017-05-18 (×2): 20 mg via ORAL
  Filled 2017-05-16 (×2): qty 1

## 2017-05-16 MED ORDER — ACETAMINOPHEN 650 MG RE SUPP: 650.0000 mg | Freq: Four times a day (QID) | RECTAL | Status: DC | PRN

## 2017-05-16 MED ORDER — SUCCINYLCHOLINE CHLORIDE 200 MG/10ML IV SOSY
PREFILLED_SYRINGE | INTRAVENOUS | Status: DC | PRN
  Administered 2017-05-16: 140 mg via INTRAVENOUS

## 2017-05-16 MED ORDER — DILTIAZEM HCL ER COATED BEADS 120 MG PO CP24: 120.0000 mg | ORAL_CAPSULE | Freq: Every day | ORAL | Status: DC

## 2017-05-16 MED ORDER — PHENOL 1.4 % MT LIQD
1.0000 | OROMUCOSAL | Status: DC | PRN
  Filled 2017-05-16: qty 177

## 2017-05-16 MED ORDER — LACTATED RINGERS IV SOLN
INTRAVENOUS | Status: DC | PRN
  Administered 2017-05-16 (×2): via INTRAVENOUS

## 2017-05-16 MED ORDER — MAGNESIUM CITRATE PO SOLN: 1.0000 | Freq: Once | ORAL | Status: DC | PRN

## 2017-05-16 MED ORDER — DOCUSATE SODIUM 100 MG PO CAPS
100.0000 mg | ORAL_CAPSULE | Freq: Two times a day (BID) | ORAL | Status: DC
  Administered 2017-05-17 – 2017-05-18 (×4): 100 mg via ORAL
  Filled 2017-05-16 (×4): qty 1

## 2017-05-16 MED ORDER — GABAPENTIN 300 MG PO CAPS: 300.0000 mg | ORAL_CAPSULE | Freq: Once | ORAL | Status: DC

## 2017-05-16 MED ORDER — ZOLPIDEM TARTRATE 5 MG PO TABS: 5.0000 mg | ORAL_TABLET | Freq: Every evening | ORAL | Status: DC | PRN

## 2017-05-16 MED ORDER — SODIUM CHLORIDE 0.45 % IV SOLN: INTRAVENOUS | Status: DC

## 2017-05-16 MED ORDER — GABAPENTIN 300 MG PO CAPS
300.0000 mg | ORAL_CAPSULE | Freq: Three times a day (TID) | ORAL | Status: DC
  Administered 2017-05-17 – 2017-05-18 (×5): 300 mg via ORAL
  Filled 2017-05-16 (×5): qty 1

## 2017-05-16 MED ORDER — SUGAMMADEX SODIUM 200 MG/2ML IV SOLN
INTRAVENOUS | Status: DC | PRN
  Administered 2017-05-16: 300 mg via INTRAVENOUS

## 2017-05-16 MED ORDER — SODIUM CHLORIDE 0.9 % IV SOLN: 250.0000 mL | INTRAVENOUS | Status: DC | PRN

## 2017-05-16 MED ORDER — ACETAMINOPHEN 650 MG RE SUPP: 650.0000 mg | RECTAL | Status: DC | PRN

## 2017-05-16 MED ORDER — METHOCARBAMOL 500 MG PO TABS
500.0000 mg | ORAL_TABLET | Freq: Four times a day (QID) | ORAL | Status: DC | PRN
Start: 2017-05-16 — End: 2017-05-16

## 2017-05-16 MED ORDER — HYDROMORPHONE HCL 1 MG/ML IJ SOLN
0.2500 mg | INTRAMUSCULAR | Status: DC | PRN
  Administered 2017-05-16 (×2): 0.5 mg via INTRAVENOUS

## 2017-05-16 MED ORDER — EPINEPHRINE PF 1 MG/ML IJ SOLN
INTRAMUSCULAR | Status: AC
  Filled 2017-05-16: qty 1

## 2017-05-16 MED ORDER — ACETAMINOPHEN 325 MG PO TABS: 650.0000 mg | ORAL_TABLET | Freq: Four times a day (QID) | ORAL | Status: DC | PRN

## 2017-05-16 MED ORDER — TAMSULOSIN HCL 0.4 MG PO CAPS: 0.4000 mg | ORAL_CAPSULE | Freq: Every day | ORAL | Status: DC

## 2017-05-16 MED ORDER — ESCITALOPRAM OXALATE 20 MG PO TABS
20.0000 mg | ORAL_TABLET | Freq: Every day | ORAL | Status: DC
  Administered 2017-05-17 – 2017-05-18 (×2): 20 mg via ORAL
  Filled 2017-05-16 (×2): qty 1

## 2017-05-16 MED ORDER — ESCITALOPRAM OXALATE 20 MG PO TABS: 20.0000 mg | ORAL_TABLET | Freq: Every day | ORAL | Status: DC

## 2017-05-16 MED ORDER — ACETAMINOPHEN 325 MG PO TABS: 650.0000 mg | ORAL_TABLET | ORAL | Status: DC | PRN

## 2017-05-16 MED ORDER — ONDANSETRON HCL 4 MG/2ML IJ SOLN
4.0000 mg | Freq: Four times a day (QID) | INTRAMUSCULAR | Status: DC | PRN
  Administered 2017-05-16: 4 mg via INTRAVENOUS

## 2017-05-16 MED ORDER — RISAQUAD PO CAPS
1.0000 | ORAL_CAPSULE | Freq: Every day | ORAL | Status: DC
  Administered 2017-05-17 – 2017-05-18 (×2): 1 via ORAL
  Filled 2017-05-16 (×2): qty 1

## 2017-05-16 MED ORDER — OXYCODONE HCL 5 MG PO TABS: 5.0000 mg | ORAL_TABLET | ORAL | Status: DC | PRN

## 2017-05-16 MED ORDER — THROMBIN (RECOMBINANT) 5000 UNITS EX SOLR
CUTANEOUS | Status: AC
  Filled 2017-05-16: qty 10000

## 2017-05-16 SURGICAL SUPPLY — 49 items
AGENT HMST SPONGE THK3/8 (HEMOSTASIS)
BAG SPEC THK2 15X12 ZIP CLS (MISCELLANEOUS) ×1
BAG ZIPLOCK 12X15 (MISCELLANEOUS) ×1 IMPLANT
CLOTH 2% CHLOROHEXIDINE 3PK (PERSONAL CARE ITEMS) ×2 IMPLANT
COVER SURGICAL LIGHT HANDLE (MISCELLANEOUS) ×2 IMPLANT
DRAPE MICROSCOPE LEICA (MISCELLANEOUS) ×2 IMPLANT
DRAPE SHEET LG 3/4 BI-LAMINATE (DRAPES) ×1 IMPLANT
DRAPE SURG 17X11 SM STRL (DRAPES) ×2 IMPLANT
DRAPE UTILITY XL STRL (DRAPES) ×2 IMPLANT
DRSG AQUACEL AG ADV 3.5X 4 (GAUZE/BANDAGES/DRESSINGS) IMPLANT
DRSG AQUACEL AG ADV 3.5X 6 (GAUZE/BANDAGES/DRESSINGS) ×1 IMPLANT
DRSG EMULSION OIL 3X3 NADH (GAUZE/BANDAGES/DRESSINGS) ×1 IMPLANT
DURAPREP 26ML APPLICATOR (WOUND CARE) ×2 IMPLANT
DURASEAL SPINE SEALANT 3ML (MISCELLANEOUS) IMPLANT
ELECT BLADE TIP CTD 4 INCH (ELECTRODE) ×2 IMPLANT
ELECT REM PT RETURN 15FT ADLT (MISCELLANEOUS) ×2 IMPLANT
GAUZE SPONGE 4X4 12PLY STRL (GAUZE/BANDAGES/DRESSINGS) ×1 IMPLANT
GLOVE BIOGEL PI IND STRL 7.0 (GLOVE) ×1 IMPLANT
GLOVE BIOGEL PI INDICATOR 7.0 (GLOVE) ×1
GLOVE SURG SS PI 7.0 STRL IVOR (GLOVE) ×2 IMPLANT
GLOVE SURG SS PI 7.5 STRL IVOR (GLOVE) ×2 IMPLANT
GLOVE SURG SS PI 8.0 STRL IVOR (GLOVE) ×4 IMPLANT
GOWN STRL REUS W/TWL XL LVL3 (GOWN DISPOSABLE) ×4 IMPLANT
HEMOSTAT SPONGE AVITENE ULTRA (HEMOSTASIS) IMPLANT
IV CATH 14GX2 1/4 (CATHETERS) ×1 IMPLANT
KIT BASIN OR (CUSTOM PROCEDURE TRAY) ×2 IMPLANT
KIT POSITIONING SURG ANDREWS (MISCELLANEOUS) ×2 IMPLANT
MANIFOLD NEPTUNE II (INSTRUMENTS) ×2 IMPLANT
NDL SPNL 18GX3.5 QUINCKE PK (NEEDLE) ×2 IMPLANT
NEEDLE SPNL 18GX3.5 QUINCKE PK (NEEDLE) ×4 IMPLANT
PACK LAMINECTOMY ORTHO (CUSTOM PROCEDURE TRAY) ×2 IMPLANT
PAD ABD 8X10 STRL (GAUZE/BANDAGES/DRESSINGS) ×1 IMPLANT
PATTIES SURGICAL .5 X.5 (GAUZE/BANDAGES/DRESSINGS) IMPLANT
PATTIES SURGICAL .75X.75 (GAUZE/BANDAGES/DRESSINGS) ×2 IMPLANT
RUBBERBAND STERILE (MISCELLANEOUS) ×4 IMPLANT
SPONGE SURGIFOAM ABS GEL 100 (HEMOSTASIS) ×2 IMPLANT
STAPLER VISISTAT (STAPLE) ×1 IMPLANT
STRIP CLOSURE SKIN 1/2X4 (GAUZE/BANDAGES/DRESSINGS) IMPLANT
SUT NURALON 4 0 TR CR/8 (SUTURE) IMPLANT
SUT PROLENE 3 0 PS 2 (SUTURE) IMPLANT
SUT VIC AB 1 CT1 27 (SUTURE) ×4
SUT VIC AB 1 CT1 27XBRD ANTBC (SUTURE) IMPLANT
SUT VIC AB 1-0 CT2 27 (SUTURE) ×2 IMPLANT
SUT VIC AB 2-0 CT1 27 (SUTURE)
SUT VIC AB 2-0 CT1 TAPERPNT 27 (SUTURE) IMPLANT
SUT VIC AB 2-0 CT2 27 (SUTURE) IMPLANT
SYR 3ML LL SCALE MARK (SYRINGE) IMPLANT
TOWEL OR 17X26 10 PK STRL BLUE (TOWEL DISPOSABLE) ×2 IMPLANT
YANKAUER SUCT BULB TIP NO VENT (SUCTIONS) ×2 IMPLANT

## 2017-05-16 NOTE — Interval H&P Note (Signed)
History and Physical Interval Note:  05/16/2017 7:13 PM  [name]  has presented today for surgery, with the diagnosis of recurrent disc herniation lumbar two to lumbar three  The various methods of treatment have been discussed with the patient and family. After consideration of risks, benefits and other options for treatment, the patient has consented to  Procedure(s): REVISION LUMBAR DECOMPRESSION LUMBAR TWO TO LUMBAR THREE (N/A) as a surgical intervention .  The patient's history has been reviewed, patient examined, no change in status, stable for surgery.  I have reviewed the patient's chart and labs.  Questions were answered to the patient's satisfaction.   Severe constriction a thecal sac concerned for evolving cauda equina.   Lockie Bothun C

## 2017-05-16 NOTE — Anesthesia Procedure Notes (Signed)
Procedure Name: Intubation Date/Time: 05/16/2017 7:31 PM Performed by: Vanessa Durhamochran, Hayward Rylander Glenn, CRNA Pre-anesthesia Checklist: Patient identified, Emergency Drugs available, Suction available and Patient being monitored Patient Re-evaluated:Patient Re-evaluated prior to induction Oxygen Delivery Method: Circle system utilized Preoxygenation: Pre-oxygenation with 100% oxygen Induction Type: IV induction, Cricoid Pressure applied and Rapid sequence Laryngoscope Size: 2 and Miller Grade View: Grade I Tube type: Oral Tube size: 8.0 mm Number of attempts: 1 Airway Equipment and Method: Stylet Placement Confirmation: ETT inserted through vocal cords under direct vision,  positive ETCO2 and breath sounds checked- equal and bilateral Secured at: 23 cm Tube secured with: Tape Dental Injury: Teeth and Oropharynx as per pre-operative assessment

## 2017-05-16 NOTE — Brief Op Note (Signed)
05/16/2017  10:23 PM  PATIENT:  [name]  57 y.o. male  PRE-OPERATIVE DIAGNOSIS:  recurrent disc herniation lumbar two to lumbar three  POST-OPERATIVE DIAGNOSIS:  recurrent disc herniation lumbar two to lumbar three  PROCEDURE:  Procedure(s): REVISION LUMBAR DECOMPRESSION LUMBAR TWO TO LUMBAR THREE (N/A)  SURGEON:  Surgeon(s) and Role:    * Jene EveryBeane, Milia Warth, MD - Primary    * Ranee GosselinGioffre, Ronald, MD - Assisting  PHYSICIAN ASSISTANT:   ASSISTA  Gioffree   ANESTHESIA:   general  EBL:  150 mL   BLOOD ADMINISTERED:none  DRAINS: none   LOCAL MEDICATIONS USED:  MARCAINE     SPECIMEN:  Source of Specimen:  L23  DISPOSITION OF SPECIMEN:  PATHOLOGY  COUNTS:  YES  TOURNIQUET:  * No tourniquets in log *  DICTATION: .Other Dictation: Dictation Number [id number]  PLAN OF CARE: Admit to inpatient   PATIENT DISPOSITION:  PACU - hemodynamically stable.   Delay start of Pharmacological VTE agent (>24hrs) due to surgical blood loss or risk of bleeding: yes

## 2017-05-16 NOTE — Anesthesia Postprocedure Evaluation (Signed)
Anesthesia Post Note  Patient: Joshua HuaRichard L Medico  Procedure(s) Performed: REVISION LUMBAR DECOMPRESSION LUMBAR TWO TO LUMBAR THREE (N/A Back)     Patient location during evaluation: PACU Anesthesia Type: General Level of consciousness: awake Pain management: pain level controlled Vital Signs Assessment: post-procedure vital signs reviewed and stable Respiratory status: spontaneous breathing Cardiovascular status: stable Anesthetic complications: no    Last Vitals:  Vitals:   05/16/17 2209 05/16/17 2211  BP: (!) 166/96 (!) 162/92  Pulse: 79 77  Resp: 15 16  Temp: 36.8 C   SpO2: 100% 100%    Last Pain:  Vitals:   05/16/17 2209  TempSrc:   PainSc: Asleep                 Jaleigh Mccroskey

## 2017-05-16 NOTE — Transfer of Care (Signed)
Immediate Anesthesia Transfer of Care Note  Patient: Joshua HuaRichard L Allen  Procedure(s) Performed: REVISION LUMBAR DECOMPRESSION LUMBAR TWO TO LUMBAR THREE (N/A Back)  Patient Location: PACU  Anesthesia Type:General  Level of Consciousness: drowsy and patient cooperative  Airway & Oxygen Therapy: Patient Spontanous Breathing and Patient connected to face mask  Post-op Assessment: Report given to RN and Post -op Vital signs reviewed and stable  Post vital signs: Reviewed and stable  Last Vitals:  Vitals Value Taken Time  BP 166/96 05/16/2017 10:09 PM  Temp    Pulse 77 05/16/2017 10:11 PM  Resp 16 05/16/2017 10:11 PM  SpO2 100 % 05/16/2017 10:11 PM  Vitals shown include unvalidated device data.  Last Pain:  Vitals:   05/16/17 1733  TempSrc: Oral         Complications: No apparent anesthesia complications

## 2017-05-16 NOTE — Progress Notes (Signed)
Patient was taken downstairs in the bed for surgery. Left the floor in stable condition.

## 2017-05-16 NOTE — H&P (Signed)
[name] is an 57 y.o. male.   Chief Complaint: back and leg pain HPI: patient underwent decompression L2-3 by Dr. Tonita Cong 8 days ago. For the last few days has had increased R thigh pain same distribution as pre-op, no injury or change in activity. Since yesterday afternoon he has had significant increase in back pain and new B/L LE radicular pain (posterior thighs to calves), severe. He had some urinary retention post-op and did require I&O cath last admission, he has been voiding more often and less amounts than pre-op. Denies constipation. Denies numbness, tingling, weakness in legs. Denies saddle anesthesia. He was seen urgently in our office today and STAT MRI was significant for recurrent HNP and hematoma compressing the thecal sac and he was direct admitted.  Past Medical History:  Diagnosis Date  . Arthritis    "right knee"  . Depression   . GERD (gastroesophageal reflux disease)   . High cholesterol   . History of kidney stones    "33 years ago"  . Hypertension   . Irregular heart rhythm   . Pneumonia   . Sleep apnea    wears CPAP    Past Surgical History:  Procedure Laterality Date  . HERNIA REPAIR    . KNEE SURGERY Right    x2  . LUMBAR LAMINECTOMY/DECOMPRESSION MICRODISCECTOMY N/A 05/08/2017   Procedure: Microlumbar decompression Lumbar two-three;  Surgeon: Susa Day, MD;  Location: [facility];  Service: Orthopedics;  Laterality: N/A;    Family History  Problem Relation Age of Onset  . Coronary artery disease Father   . Diabetes Father   . Heart disease Mother    Social History:  reports that he has never smoked. He has never used smokeless tobacco. He reports that he drinks alcohol. He reports that he does not use drugs.  Allergies:  Allergies  Allergen Reactions  . Iodinated Diagnostic Agents Palpitations and Hypertension  . Iohexol Palpitations and Hypertension    Increased blood pressure     Medications Prior to Admission  Medication Sig Dispense  Refill  . aspirin EC 81 MG tablet Take 1 tablet (81 mg total) by mouth daily. Resume 4 days post-op    . bismuth subsalicylate (PEPTO BISMOL) 262 MG/15ML suspension Take 30 mLs by mouth daily as needed for indigestion or diarrhea or loose stools.    Marland Kitchen diltiazem (CARDIZEM CD) 120 MG 24 hr capsule Take 1 capsule (120 mg total) by mouth daily. 30 capsule 0  . docusate sodium (COLACE) 100 MG capsule Take 1 capsule (100 mg total) by mouth 2 (two) times daily. 40 capsule 0  . escitalopram (LEXAPRO) 20 MG tablet Take 20 mg by mouth daily.    Marland Kitchen gabapentin (NEURONTIN) 300 MG capsule Take 1 capsule (300 mg total) by mouth 3 (three) times daily. 90 capsule 1  . HYDROcodone-acetaminophen (NORCO/VICODIN) 5-325 MG tablet Take 1 tablet by mouth daily as needed for pain.    Marland Kitchen ibuprofen (ADVIL,MOTRIN) 200 MG tablet Take 2 tablets (400 mg total) by mouth daily as needed. Resume 5 days post-op as needed 30 tablet 0  . meloxicam (MOBIC) 7.5 MG tablet Take 1 tablet (7.5 mg total) by mouth daily. Resume 5 days post-op as needed. Do not combine with other Anti-inflammatories    . methocarbamol (ROBAXIN) 500 MG tablet Take 1 tablet (500 mg total) by mouth every 6 (six) hours as needed for muscle spasms. 40 tablet 1  . Multiple Vitamin (MULTIVITAMIN WITH MINERALS) TABS tablet Take 1 tablet by mouth  daily.    . oxyCODONE (ROXICODONE) 5 MG immediate release tablet Take 1-2 tablets (5-10 mg total) by mouth every 4 (four) hours as needed. 40 tablet 0  . polyethylene glycol (MIRALAX / GLYCOLAX) packet Take 17 g by mouth daily as needed for mild constipation. 14 each 0  . quinapril (ACCUPRIL) 20 MG tablet Take 20 mg by mouth daily.     . Na Sulfate-K Sulfate-Mg Sulf (SUPREP BOWEL PREP KIT) 17.5-3.13-1.6 GM/177ML SOLN Take 1 kit by mouth as directed. (Patient not taking: Reported on 05/16/2017) 1 Bottle 0  . polyethylene glycol-electrolytes (TRILYTE) 420 g solution Take 4,000 mLs by mouth as directed. (Patient not taking: Reported  on 05/16/2017) 4000 mL 0    Results for orders placed or performed during the hospital encounter of 05/16/17 (from the past 48 hour(s))  Type and screen [place]     Status: None   Collection Time: 05/16/17  5:55 PM  Result Value Ref Range   ABO/RH(D) O NEG    Antibody Screen NEG    Sample Expiration      05/19/2017 Performed at [facility], [place] [address], [place], [place] [address]   ABO/Rh     Status: None   Collection Time: 05/16/17  5:55 PM  Result Value Ref Range   ABO/RH(D)      Jenetta Downer NEG Performed at [facility] [address], [place], [place] [address]   Basic metabolic panel     Status: Abnormal   Collection Time: 05/16/17  5:58 PM  Result Value Ref Range   Sodium 138 135 - 145 mmol/L   Potassium 4.1 3.5 - 5.1 mmol/L   Chloride 104 101 - 111 mmol/L   CO2 24 22 - 32 mmol/L   Glucose, Bld 105 (H) 65 - 99 mg/dL   BUN 16 6 - 20 mg/dL   Creatinine, Ser 1.13 0.61 - 1.24 mg/dL   Calcium 9.2 8.9 - 10.3 mg/dL   GFR calc non Af Amer >60 >60 mL/min   GFR calc Af Amer >60 >60 mL/min    Comment: (NOTE) The eGFR has been calculated using the CKD EPI equation. This calculation has not been validated in all clinical situations. eGFR's persistently <60 mL/min signify possible Chronic Kidney Disease.    Anion gap 10 5 - 15    Comment: Performed at [facility], [place] [address] [place], [place] [address]  CBC WITH DIFFERENTIAL     Status: None   Collection Time: 05/16/17  5:58 PM  Result Value Ref Range   WBC 7.6 4.0 - 10.5 K/uL   RBC 4.87 4.22 - 5.81 MIL/uL   Hemoglobin 14.8 13.0 - 17.0 g/dL   HCT 43.1 39.0 - 52.0 %   MCV 88.5 78.0 - 100.0 fL   MCH 30.4 26.0 - 34.0 pg   MCHC 34.3 30.0 - 36.0 g/dL   RDW 13.2 11.5 - 15.5 %   Platelets 263 150 - 400 K/uL   Neutrophils Relative % 49 %   Neutro Abs 3.8 1.7 - 7.7 K/uL   Lymphocytes Relative 36 %   Lymphs Abs 2.8 0.7 - 4.0 K/uL   Monocytes Relative  10 %   Monocytes Absolute 0.8 0.1 - 1.0 K/uL   Eosinophils Relative 4 %   Eosinophils Absolute 0.3 0.0 - 0.7 K/uL   Basophils Relative 1 %   Basophils Absolute 0.0 0.0 - 0.1 K/uL    Comment: Performed at [facility],  [place] [address] [place], [place] [address]  APTT     Status: None   Collection Time: 05/16/17  5:58 PM  Result Value Ref Range   aPTT 31 24 - 36 seconds    Comment: Performed at [facility], [place] [address] [place], [place] [address]  Protime-INR     Status: None   Collection Time: 05/16/17  5:58 PM  Result Value Ref Range   Prothrombin Time 13.0 11.4 - 15.2 seconds   INR 0.99     Comment: Performed at [facility], [place] [address] [place], [place] [address]   Dg Spine Portable 1 View  Result Date: 05/16/2017 CLINICAL DATA:  Lumbar laminectomy and microdiscectomy. EXAM: PORTABLE SPINE - 1 VIEW COMPARISON:  None. FINDINGS: Intraoperative radiograph labeled #2 shows posterior surgical instruments, with a blunt probe overlying the spinal canal at the level of the L2-L3 intervertebral disc space. IMPRESSION: Intraoperative localization of L2-3. Electronically Signed   By: Earle Gell M.D.   On: 05/16/2017 21:33   Dg Spine Portable 1 View  Result Date: 05/16/2017 CLINICAL DATA:  Surgical level L2-3. Please label spinous processes. EXAM: PORTABLE SPINE - 1 VIEW COMPARISON:  None. FINDINGS: A lateral intraoperative view of the lumbar spine demonstrates a localizing probe at the L2 level just caudal to the pedicle. Moderate-to-marked disc space narrowing at L5-S1 with anterolisthesis of L5 on S1, grade 1 of approximately 14 mm. IMPRESSION: Localizing probe at the L2 level. Grade 1 anterolisthesis L5 on S1 with moderate-to-marked disc space narrowing. Electronically Signed   By: Ashley Royalty M.D.   On: 05/16/2017 20:49    Review of Systems  Constitutional: Negative.   HENT: Negative.   Eyes: Negative.   Respiratory:  Negative.   Cardiovascular: Negative.   Gastrointestinal: Negative.   Genitourinary: Negative.   Musculoskeletal: Positive for back pain and myalgias.  Skin: Negative.   Neurological: Positive for sensory change and focal weakness.  Psychiatric/Behavioral: Negative.     Blood pressure (!) 162/92, pulse 77, temperature 98.3 F (36.8 C), resp. rate 16, SpO2 100 %. Physical Exam  Constitutional: He is oriented to person, place, and time. He appears distressed.  HENT:  Head: Normocephalic.  Eyes: Pupils are equal, round, and reactive to light.  Neck: Normal range of motion.  Cardiovascular: Normal rate.  Respiratory: Effort normal.  GI: Soft.  Musculoskeletal:  Incision healing well no sign of infection + SLR bilateral Decreased ROM Lspine 1-2 beats clonus bilateral feet Brisk patellar reflexes Negative Babinski Normal sensation Rectal with normal tone and sensation Antalgic gait with walker  Neurological: He is alert and oriented to person, place, and time. He displays abnormal reflex.     Assessment/Plan Recurrent HNP/hematoma L2-3  Plan admit from office Surgery today for revision decompression/evacuation of hematoma Bladder scan on admit and prn Dr Tonita Cong discussed risks complications and alternatives with pt and he desires to proceed  Cecilie Kicks., PA-C  For Dr Tonita Cong 05/16/2017, 10:45 PM

## 2017-05-16 NOTE — Anesthesia Preprocedure Evaluation (Addendum)
Anesthesia Evaluation  Patient identified by MRN, date of birth, ID band Patient awake    Reviewed: Allergy & Precautions, NPO status   Airway Mallampati: II  TM Distance: >3 FB     Dental   Pulmonary sleep apnea ,    breath sounds clear to auscultation       Cardiovascular hypertension,  Rhythm:Regular Rate:Normal     Neuro/Psych    GI/Hepatic Neg liver ROS, GERD  ,  Endo/Other  negative endocrine ROS  Renal/GU negative Renal ROS     Musculoskeletal  (+) Arthritis ,   Abdominal   Peds  Hematology   Anesthesia Other Findings   Reproductive/Obstetrics                             Anesthesia Physical Anesthesia Plan  ASA: III  Anesthesia Plan: General   Post-op Pain Management:    Induction: Intravenous  PONV Risk Score and Plan: 2 and Treatment may vary due to age or medical condition, Ondansetron, Dexamethasone and Midazolam  Airway Management Planned: Oral ETT  Additional Equipment:   Intra-op Plan:   Post-operative Plan: Extubation in OR  Informed Consent: I have reviewed the patients History and Physical, chart, labs and discussed the procedure including the risks, benefits and alternatives for the proposed anesthesia with the patient or authorized representative who has indicated his/her understanding and acceptance.   Dental advisory given  Plan Discussed with: CRNA and Anesthesiologist  Anesthesia Plan Comments:        Anesthesia Quick Evaluation

## 2017-05-17 ENCOUNTER — Encounter (HOSPITAL_COMMUNITY): Payer: Self-pay

## 2017-05-17 NOTE — Progress Notes (Addendum)
Subjective: 1 Day Post-Op Procedure(s) (LRB): REVISION LUMBAR DECOMPRESSION LUMBAR TWO TO LUMBAR THREE (N/A) Patient reports pain as 2 on 0-10 scale.   Discussed positioning and retained position strap. No leg pain  Objective: Vital signs in last 24 hours: Temp:  [98.1 F (36.7 C)-99.3 F (37.4 C)] 98.1 F (36.7 C) (04/13 0500) Pulse Rate:  [58-79] 66 (04/13 0500) Resp:  [8-18] 18 (04/13 0500) BP: (142-183)/(78-99) 153/82 (04/13 0500) SpO2:  [96 %-100 %] 96 % (04/13 0500) Weight:  [133.4 kg (294 lb)] 133.4 kg (294 lb) (04/12 2340)  Intake/Output from previous day: 04/12 0701 - 04/13 0700 In: 2260 [P.O.:420; I.V.:1790; IV Piggyback:50] Out: 1790 [Urine:1640; Blood:150] Intake/Output this shift: No intake/output data recorded.  Recent Labs    05/16/17 1758  HGB 14.8   Recent Labs    05/16/17 1758  WBC 7.6  RBC 4.87  HCT 43.1  PLT 263   Recent Labs    05/16/17 1758  NA 138  K 4.1  CL 104  CO2 24  BUN 16  CREATININE 1.13  GLUCOSE 105*  CALCIUM 9.2   Recent Labs    05/16/17 1758  INR 0.99    Neurologically intact Sensation intact distally Intact pulses distally Incision: dressing C/D/I Compartment soft Rectal good tone and sensation. Strap markings resolved. Good pulses and cap refill distal. No cyanosis.  Admit  Assessment/Plan: 1 Day Post-Op Procedure(s) (LRB): REVISION LUMBAR DECOMPRESSION LUMBAR TWO TO LUMBAR THREE (N/A) Advance diet Up with therapy Plan for discharge tomorrow  No leg pain. No numbness or skin issues from position straps residual. Bilateral.  Discussed with nursing void by 10;30 and post void scan  Continue neurovascular checks.  Lumbar LSO brace Dressing change tomorrow with AquaCell 6 inch    Modesto Ganoe C 05/17/2017, 7:30 AM

## 2017-05-17 NOTE — Op Note (Deleted)
  The note originally documented on this encounter has been moved the the encounter in which it belongs.  

## 2017-05-17 NOTE — Evaluation (Signed)
Physical Therapy Evaluation Patient Details Name: [name] MRN: [id number] DOB: 1960/10/01 Today's Date: 05/17/2017   History of Present Illness  [name] is a 56yo white male who comes to [facility] for microlumbar decompression  L2-3 revision. PMH: GERD, HTN, OSA, CPAP, knee surgery x2  Clinical Impression  Pt admitted with above diagnosis. Pt currently with functional limitations due to the deficits listed below (see PT Problem List). Pt will benefit from continued PT in acute setting, doing well overall today, pain with mobility but pt reports improved from pain level prior to surgery Pt will benefit from skilled PT to increase their independence and safety with mobility to allow discharge to the venue listed below.       Follow Up Recommendations Home health PT    Equipment Recommendations  None recommended by PT    Recommendations for Other Services       Precautions / Restrictions Precautions Precautions: Back Precaution Booklet Issued: Yes (comment) Precaution Comments: reviewed back hand out avoiding "Bending/Lifting/Twisting" Restrictions Weight Bearing Restrictions: No      Mobility  Bed Mobility Overal bed mobility: Needs Assistance Bed Mobility: Rolling;Sidelying to Sit Rolling: Supervision Sidelying to sit: Min guard       General bed mobility comments: educated on log roll, min/guard to elevate trunk   Transfers Overall transfer level: Needs assistance Equipment used: Rolling walker (2 wheeled) Transfers: Sit to/from Stand Sit to Stand: Min guard;From elevated surface         General transfer comment: min/guard to rise and stabilize, cues to power up with LEs  Ambulation/Gait Ambulation/Gait assistance: Min guard Ambulation Distance (Feet): 60 Feet Assistive device: Rolling walker (2 wheeled) Gait Pattern/deviations: WFL(Within Functional Limits)     General Gait Details: distance limited by increasing pain (however pt reports improved  from prior to this surgery)  Stairs            Wheelchair Mobility    Modified Rankin (Stroke Patients Only)       Balance Overall balance assessment: Needs assistance           Standing balance-Leahy Scale: Fair Standing balance comment: reliant on UE support for dynamic activities                             Pertinent Vitals/Pain Pain Assessment: No/denies pain    Home Living Family/patient expects to be discharged to:: Private residence Living Arrangements: Spouse/significant other Available Help at Discharge: Family Type of Home: Mobile home Home Access: Stairs to enter Entrance Stairs-Rails: Can reach both Entrance Stairs-Number of Steps: 6 Home Layout: One level Home Equipment: Cane - single point;Walker - 2 wheels      Prior Function Level of Independence: Independent with assistive device(s)         Comments: amb with Rw since last surgery     Hand Dominance        Extremity/Trunk Assessment        Lower Extremity Assessment Lower Extremity Assessment: Overall WFL for tasks assessed       Communication   Communication: No difficulties  Cognition                                              General Comments      Exercises     Assessment/Plan    PT  Assessment Patient needs continued PT services  PT Problem List Decreased knowledge of use of DME;Decreased mobility;Decreased activity tolerance;Pain;Decreased knowledge of precautions       PT Treatment Interventions Gait training;Stair training;Functional mobility training;Therapeutic activities;Therapeutic exercise;Patient/family education;Balance training    PT Goals (Current goals can be found in the Care Plan section)  Acute Rehab PT Goals Patient Stated Goal: regain independent mobility  PT Goal Formulation: With patient Time For Goal Achievement: 05/24/17 Potential to Achieve Goals: Good    Frequency 7X/week   Barriers to discharge         Co-evaluation               AM-PAC PT "6 Clicks" Daily Activity  Outcome Measure Difficulty turning over in bed (including adjusting bedclothes, sheets and blankets)?: A Little Difficulty moving from lying on back to sitting on the side of the bed? : A Little Difficulty sitting down on and standing up from a chair with arms (e.g., wheelchair, bedside commode, etc,.)?: A Little Help needed moving to and from a bed to chair (including a wheelchair)?: A Little Help needed walking in hospital room?: A Little Help needed climbing 3-5 steps with a railing? : A Lot 6 Click Score: 17    End of Session   Activity Tolerance: Patient limited by pain;Patient tolerated treatment well Patient left: in chair;with call bell/phone within reach Nurse Communication: Mobility status PT Visit Diagnosis: Difficulty in walking, not elsewhere classified (R26.2);Pain;Other abnormalities of gait and mobility (R26.89) Pain - part of body: (back)    Time: 9811-91470926-0944 PT Time Calculation (min) (ACUTE ONLY): 18 min   Charges:   PT Evaluation $PT Eval Low Complexity: 1 Low     PT G CodesDrucilla Chalet:        Dravin Lance, PT Pager: [phone] 05/17/2017   [facility] CenterWILLIAMS,Demetrion Wesby 05/17/2017, 10:40 AM

## 2017-05-17 NOTE — Evaluation (Signed)
Occupational Therapy Evaluation Patient Details Name: [name] MRN: [id number] DOB: 04-07-1960 Today's Date: 05/17/2017    History of Present Illness [name] is a 56yo white male who comes to [facility] for microlumbar decompression  L2-3 revision. PMH: GERD, HTN, OSA, CPAP, knee surgery x2   Clinical Impression   Pt was admitted for the above sx.  He did not have OT after his first sx. Educated on ADLs and AE.  No further OT is needed at this time    Follow Up Recommendations  Supervision - Intermittent    Equipment Recommendations  3 in 1 bedside commode(may need wide)    Recommendations for Other Services       Precautions / Restrictions Precautions Precautions: Back Precaution Booklet Issued: Yes (comment) Precaution Comments: reviewed back hand out avoiding "Bending/Lifting/Twisting" Restrictions Weight Bearing Restrictions: No      Mobility Bed Mobility Overal bed mobility: Needs Assistance Bed Mobility: Rolling;Sidelying to Sit Rolling: Supervision Sidelying to sit: Min guard       General bed mobility comments: oob.  Bed mob by PT  Transfers Overall transfer level: Needs assistance Equipment used: Rolling walker (2 wheeled) Transfers: Sit to/from Stand Sit to Stand: Min guard         General transfer comment: for safety    Balance                                        ADL either performed or assessed with clinical judgement   ADL Overall ADL's : Needs assistance/impaired                             Toileting- Clothing Manipulation and Hygiene: Minimal assistance;Sit to/from stand         General ADL Comments: pt did not have OT after first sx. He is able to cross legs for adls. Educated on AE.  Pt's money is very tight as his wife is out of work due to extended illness and he is out with this injury.  Issued long sponge and toilet aide.  Pt states he had some trouble getting off of standard commode; he is  6'3".  He did not get one last time as he was concerned about the cost. Recommended that he have this to make it easier to comply with back precautions     Vision         Perception     Praxis      Pertinent Vitals/Pain Pain Assessment: No/denies pain Pain Score: 7  Pain Location: surgical site Pain Intervention(s): Limited activity within patient's tolerance;Monitored during session;Premedicated before session;Repositioned     Hand Dominance     Extremity/Trunk Assessment            Communication Communication Communication: No difficulties   Cognition Arousal/Alertness: Awake/alert Behavior During Therapy: WFL for tasks assessed/performed Overall Cognitive Status: Within Functional Limits for tasks assessed                                     General Comments       Exercises     Shoulder Instructions      Home Living Family/patient expects to be discharged to:: Private residence Living Arrangements: Spouse/significant other Available Help at Discharge: Family Type of Home: Mobile  home Home Access: Stairs to enter Entrance Stairs-Number of Steps: 6 Entrance Stairs-Rails: Can reach both Home Layout: One level            Pt has a walk in shower, without a seat; standard commode   Home Equipment: Cane - single point;Walker - 2 wheels          Prior Functioning/Environment Level of Independence: Independent with assistive device(s)        Comments: amb with Rw since last surgery        OT Problem List:        OT Treatment/Interventions:      OT Goals(Current goals can be found in the care plan section) Acute Rehab OT Goals Patient Stated Goal: regain independent mobility   OT Frequency:     Barriers to D/C:            Co-evaluation              AM-PAC PT "6 Clicks" Daily Activity     Outcome Measure Help from another person eating meals?: None Help from another person taking care of personal grooming?: A  Little Help from another person toileting, which includes using toliet, bedpan, or urinal?: A Little Help from another person bathing (including washing, rinsing, drying)?: A Little Help from another person to put on and taking off regular upper body clothing?: A Little Help from another person to put on and taking off regular lower body clothing?: A Little 6 Click Score: 19   End of Session    Activity Tolerance: Patient tolerated treatment well Patient left: in chair;with call bell/phone within reach  OT Visit Diagnosis: Muscle weakness (generalized) (M62.81)                Time: 1610-96041023-1045 OT Time Calculation (min): 22 min Charges:  OT General Charges $OT Visit: 1 Visit OT Evaluation $OT Eval Low Complexity: 1 Low G-Codes:     Marica OtterMaryellen Debanhi Blaker, OTR/L [phone] 05/17/2017  Candi Profit 05/17/2017, 11:49 AM

## 2017-05-17 NOTE — Plan of Care (Signed)
Reviewed plan of care with pt, specifically pain control, safety and back precautions, and importance of notifying RN with any questions or concerns. Pt attentive and verbalized understanding of all education.

## 2017-05-17 NOTE — Progress Notes (Signed)
    Patient ID: [name], male   DOB: 1960/06/12, 57 y.o.   MRN: [id number]   Postop PACU check. Neuro vascularly intact. Discussed vascular monitoring from positional  Straps retained. Skin over trochanters hyperemic. Compartments soft. Good capillary refill distal and good pulses  Femoral, DP, PT 2+ bilateral and now cyanosis. Discussed with OR staff. Safety portal requested. Plan continued vascular monitoring on floor.

## 2017-05-17 NOTE — Op Note (Signed)
[name], [name]               ACCOUNT NO.:  [id number]  MEDICAL RECORD NO.:  [id number]  LOCATION:                                 FACILITY:  PHYSICIAN:  Jene Every, M.D.    DATE OF BIRTH:  December 05, 1960  DATE OF PROCEDURE:  05/16/2017 DATE OF DISCHARGE:                              OPERATIVE REPORT   PREOPERATIVE DIAGNOSES: 1. Recurrent herniated nucleus pulposus, spinal stenosis at L2-3. 2. Pending cauda equina syndrome.  POSTOPERATIVE DIAGNOSES: 1. Recurrent herniated nucleus pulposus, spinal stenosis at L2-3. 2. Pending cauda equina syndrome.  PROCEDURE PERFORMED: 1. Revision lumbar decompression at L2-3 with laminectomy of L2 and     hemilaminotomies of L3. 2. Revision microlumbar decompression at L2-3.  ANESTHESIA:  General.  ASSISTANT:  Dr. Tinnie Gens.  SPECIMEN:  L2-3 disk to pathology.  HISTORY:  This is a 57 year old male who is 8 days status post a lumbar decompression diskectomy at 2-3.  He did well, went home, and yesterday evening, he had an acute onset of back and bilateral lower extremity radicular pain that was intolerable.  He was seen in the office and evaluated.  The patient had also some complaints of intermittent incomplete void.  No frank incontinence.  His exam revealed he had some bilateral straight leg raises.  He had an antalgic gait.  He had an anterolateral fine numbness on the right.  He is hyporeflexic.  One-beat of clonus.  He had a normal rectal exam with perineal sensation intact. We obtained a stat MRI of the lumbar spine which revealed severe compression of the thecal sac at L2-3 secondary to a large disk herniation and possible hematoma.  He was indicated for emergent revision lumbar decompression at 2-3 given his evolving neurologic symptomatology in the presence of a neural compressive lesion.  We discussed revision microlumbar decompression.  Risks and benefits discussed including bleeding, infection, damage to  neurovascular structures, no change in symptoms, worsening symptoms, DVT, PE, anesthetic complications, etc.  TECHNIQUE:  The patient in supine position after induction of adequate general anesthesia, 3 g Kefzol.  He was placed prone on the Gorman frame.  All bony prominences were well padded.  Lumbar region was prepped and draped in usual sterile fashion after his previous surgical incision was cleaned and staples removed.  He was placed in the [place] frame.  He initially had a IV that had pulled out and therefore in that position, required reconfiguration of his IV.  He was in the 90/90 position on the [place] frame.  He was fairly large and required lateral padding.  Foley to gravity.  The previous surgical incision was opened. Sutures removed.  We excised the skin edges.  Good bleeding tissue. Opened the subcutaneous tissue, no hematoma was noted.  Opened the deep fascia.  There was some blood associated, but no hematoma under pressure was identified.  We digitally palpated the remaining lamina of L2 and the spinous process of L2, placed a McCullough retractor.  Operating microscope was draped and brought into surgical field.  Skeletonized the previous lamina of L2 with identification.  Copious irrigation was performed prior to the opening of the deep fascia as well.  We removed 1  piece of Gelfoam that was present there.  To gain access to the disk herniation at L2-3, we proceeded with the removal of the neural arch of L2.  I used a Woodson retractor to protect the lamina of L2 with minimal compression.  This was a 2 mm, and we started centrally and proceeded cephalad to remove the residual spinous process of L2.  I performed a full laminectomy of L2.  This allowed to gain further access to the right lateral recess and the disk herniation which was into the axilla of the L2 root.  Following this, we revised the laminotomy distally.  We removed ligamentum flavum.  Hemilaminectomy of  L3 on the right had been performed.  We then decompressed the lateral recesses further to the medial border of the pedicle.  I did preserve the pars, but took approximately 50% of the facet, a bit more of the facet on the right. We had performed a foraminotomy and a partial medial hemi-facetectomy previously.  We had good restoration of thecal sac and I was able to place a neural probe above to the pedicle at L2 and below L3 without difficulty.  A bipolar electrocautery was utilized to achieve hemostasis as was a bone wax.  We protected the lateral aspect of the thecal sac and without traction.  Beneath the axilla of L2, a recurrent disk herniation was noted.  We mobilized it with a nerve hook and retrieved multiple fragments with a micropituitary, sent to pathology for appropriate evaluation.  This was then further mobilized with an Epstein.  The disk space and further fragments were removed.  We irrigated with catheter lavage.  Additional fragments were removed. Following this, the neural probe passed freely up the foramen of L2 and L3 and beneath thecal sac.  There was good restoration of thecal sac. No evidence of CSF leakage or active bleeding.  Confirmatory radiograph was obtained.  We meticulously achieved full hemostasis.  This was bone wax, temporary thrombin-soaked Gelfoam.  I then removed the McCullough retractor. Paraspinous muscles irrigated and cauterized.  I then reapproximated the dorsolumbar fascia with #1 Vicryl in a figure-of-eight sutures.  The distal portion of that approximately a centimeter was left without closure for hematoma evacuation, copiously irrigated, subcu with 2-0. Skin edges looked good.  Good bleeding tissue, reapproximated with staples.  Wound was dressed sterilely, secured with an Elastoplast dressing.  Next, the patient was then placed in the operating room onto the operating room table.  It was then noted at that time that the abdominal strap  that was used to position the patient had been left into position. He had some cyanosis of his feet that were noted.  Once this was removed and he was placed on the hospital bed, there was instant return of color, good capillary refill.  Pulses were noted both from dorsalis pedis, posterior tibial as well as femoral.  After he was extubated, he was transported to the recovery room in satisfactory condition.  The patient tolerated the procedure well.  There were no complications.  Noted in the recovery room, the patient was awake, oriented, moving his toes.  Good pulses and full sensation.  Blood loss was 150 mL.     Jene Every, M.D.   ______________________________ Jene Every, M.D.   JB/MEDQ  D:  05/17/2017  T:  05/17/2017  Job:  [id number]

## 2017-05-18 MED ORDER — OXYCODONE HCL 5 MG PO TABS: 5.0000 mg | ORAL_TABLET | ORAL | 0 refills | Status: DC | PRN

## 2017-05-18 NOTE — Progress Notes (Signed)
Physical Therapy Treatment Patient Details Name: [name] MRN: [id number] DOB: 1960-02-10 Today's Date: 05/18/2017    History of Present Illness [name] is a 57yo white male who comes to [facility] for microlumbar decompression  L2-3 revision. PMH: GERD, HTN, OSA, CPAP, knee surgery x2    PT Comments    Pt is moving well. He managed stairs well and is safe to d/c home from PT standpoint.   Follow Up Recommendations  Home health PT     Equipment Recommendations  3in1 (PT)    Recommendations for Other Services       Precautions / Restrictions Precautions Precautions: Back Precaution Comments: reviewed back hand out avoiding "Bending/Lifting/Twisting" Required Braces or Orthoses: Spinal Brace Spinal Brace: Lumbar corset Restrictions Weight Bearing Restrictions: No    Mobility  Bed Mobility     Rolling: Independent Sidelying to sit: Independent       General bed mobility comments: good log rolling technique. On EOB, pt educated on brace that arrived overnight  Transfers Overall transfer level: Needs assistance Equipment used: Rolling walker (2 wheeled) Transfers: Sit to/from Stand Sit to Stand: Supervision         General transfer comment: for safety  Ambulation/Gait Ambulation/Gait assistance: Supervision;Min guard Ambulation Distance (Feet): 200 Feet Assistive device: Rolling walker (2 wheeled) Gait Pattern/deviations: WFL(Within Functional Limits)     General Gait Details: good cadence and step length   Stairs Stairs: Yes Stairs assistance: Supervision Stair Management: Two rails;Backwards;Step to pattern Number of Stairs: 6 General stair comments: Pt with good recall from previous PT after initial surgery   Wheelchair Mobility    Modified Rankin (Stroke Patients Only)       Balance Overall balance assessment: No apparent balance deficits (not formally assessed)                                           Cognition Arousal/Alertness: Awake/alert Behavior During Therapy: WFL for tasks assessed/performed Overall Cognitive Status: Within Functional Limits for tasks assessed                                        Exercises      General Comments        Pertinent Vitals/Pain Pain Assessment: 0-10 Pain Score: 3  Pain Location: surgical site Pain Intervention(s): Limited activity within patient's tolerance    Home Living                      Prior Function            PT Goals (current goals can now be found in the care plan section) Acute Rehab PT Goals Patient Stated Goal: regain independent mobility  PT Goal Formulation: With patient Time For Goal Achievement: 05/24/17 Potential to Achieve Goals: Good Progress towards PT goals: Progressing toward goals    Frequency    7X/week      PT Plan Current plan remains appropriate    Co-evaluation              AM-PAC PT "6 Clicks" Daily Activity  Outcome Measure  Difficulty turning over in bed (including adjusting bedclothes, sheets and blankets)?: None Difficulty moving from lying on back to sitting on the side of the bed? : None Difficulty sitting down on and  standing up from a chair with arms (e.g., wheelchair, bedside commode, etc,.)?: A Little Help needed moving to and from a bed to chair (including a wheelchair)?: A Little Help needed walking in hospital room?: A Little Help needed climbing 3-5 steps with a railing? : A Little 6 Click Score: 20    End of Session Equipment Utilized During Treatment: Gait belt;Back brace Activity Tolerance: Patient tolerated treatment well Patient left: in chair;with call bell/phone within reach Nurse Communication: Mobility status PT Visit Diagnosis: Difficulty in walking, not elsewhere classified (R26.2);Pain;Other abnormalities of gait and mobility (R26.89) Pain - part of body: (back)     Time: 1308-6578 PT Time Calculation (min) (ACUTE ONLY):  18 min  Charges:  $Gait Training: 8-22 mins                    G Codes:       Lauren Aguayo L. Katrinka Blazing, [place] Pager [phone] 05/18/2017    Enzo Montgomery 05/18/2017, 11:15 AM

## 2017-05-18 NOTE — Progress Notes (Signed)
   Subjective: 2 Days Post-Op Procedure(s) (LRB): REVISION LUMBAR DECOMPRESSION LUMBAR TWO TO LUMBAR THREE (N/A) Patient reports pain as mild.   Patient seen in rounds for Dr. Shelle IronBeane. Patient is well, and has had no acute complaints or problems. Reports minimal to no pain in the low back and LE. No issues overnight. Voiding well. Positive flatus. Reports that he is feeling much better.  Plan is to go Home after hospital stay.  Objective: Vital signs in last 24 hours: Temp:  [97.7 F (36.5 C)-98.8 F (37.1 C)] 97.7 F (36.5 C) (04/14 0424) Pulse Rate:  [57-68] 57 (04/14 0424) Resp:  [12-17] 17 (04/14 0424) BP: (125-150)/(72-98) 125/74 (04/14 0424) SpO2:  [95 %-99 %] 95 % (04/14 0424)  Intake/Output from previous day:  Intake/Output Summary (Last 24 hours) at 05/18/2017 0839 Last data filed at 05/18/2017 0819 Gross per 24 hour  Intake 1444.17 ml  Output 1800 ml  Net -355.83 ml    Intake/Output this shift: Total I/O In: 360 [P.O.:360] Out: -   Labs: Recent Labs    05/16/17 1758  HGB 14.8   Recent Labs    05/16/17 1758  WBC 7.6  RBC 4.87  HCT 43.1  PLT 263   Recent Labs    05/16/17 1758  NA 138  K 4.1  CL 104  CO2 24  BUN 16  CREATININE 1.13  GLUCOSE 105*  CALCIUM 9.2   Recent Labs    05/16/17 1758  INR 0.99    EXAM General - Patient is Alert and Oriented Extremity - Neurologically intact Intact pulses distally Dorsiflexion/Plantar flexion intact Compartment soft Dressing/Incision - clean, dry, no drainage Motor Function - intact, moving foot and toes well on exam.   Past Medical History:  Diagnosis Date  . Arthritis    "right knee"  . Depression   . GERD (gastroesophageal reflux disease)   . High cholesterol   . History of kidney stones    "33 years ago"  . Hypertension   . Irregular heart rhythm   . Pneumonia   . Sleep apnea    wears CPAP    Assessment/Plan: 2 Days Post-Op Procedure(s) (LRB): REVISION LUMBAR DECOMPRESSION LUMBAR  TWO TO LUMBAR THREE (N/A) Active Problems:   HNP (herniated nucleus pulposus), lumbar  Estimated body mass index is 36.75 kg/m as calculated from the following:   Height as of this encounter: 6\' 3"  (1.905 m).   Weight as of this encounter: 133.4 kg (294 lb). Advance diet Up with therapy  DVT Prophylaxis - Aspirin  Weight-Bearing as tolerated  Progressing well. Plan for DC home today. Follow up in office with Beane on Thursday.   Dimitri PedAmber Kaidan Harpster, PA-C Orthopaedic Surgery 05/18/2017, 8:39 AM

## 2017-05-18 NOTE — Progress Notes (Signed)
Pt to d/c home. AVS reviewed and "My Chart" discussed with pt. Pt capable of verbalizing medications, signs and symptoms of infection, and follow-up appointments. RN changed patient's bandage to aquacel dressing, per MD request. Remains hemodynamically stable. No signs and symptoms of distress. Educated pt to return to ER in the case of SOB, dizziness, or chest pain.

## 2017-05-18 NOTE — Care Management Note (Addendum)
Case Management Note  Patient Details  Name: [name] MRN: [id number] Date of Birth: 1960/02/12  Subjective/Objective:    Revision of Lumbar Decompression                 Action/Plan: NCM spoke to pt and wife at home to assist with care. He has RW at home. Requesting a 3n1 bedside commode. Will fax orders to his Worker's Comp Claims Adjuster, [name], [employer], # [phone] fax #[phone]. Contacted [facility] for 3n1 bedside commode for home (bariatric) to be delivered to room prior to dc.    Expected Discharge Date:  05/18/17               Expected Discharge Plan:  Home/Self Care  In-House Referral:  NA  Discharge planning Services  CM Consult  Post Acute Care Choice:  NA Choice offered to:  NA  DME Arranged:  3-N-1 DME Agency:  Other - Comment  HH Arranged:  NA HH Agency:  NA  Status of Service:  Completed, signed off  If discussed at Long Length of Stay Meetings, dates discussed:    Additional Comments:  Elliot CousinShavis, Marene Gilliam Ellen, RN 05/18/2017, 9:14 AM

## 2017-05-18 NOTE — Discharge Instructions (Signed)
WBAT with walker No lifting or excessive bending No driving while taking pain medications Dressing is waterproof Do not remove dressing before post operative appointment unless it appears compromised.  Follow up with Dr. Shelle IronBeane 05/22/17

## 2017-05-19 NOTE — Discharge Summary (Signed)
Physician Discharge Summary   Patient ID: [name] MRN: [id number] DOB/AGE: 04-24-1960 57 y.o.  Admit date: 05/16/2017 Discharge date: 05/18/2017  Primary Diagnosis: Recurrent lumbar disc herniation L2-L3  Admission Diagnoses:  Past Medical History:  Diagnosis Date  . Arthritis    "right knee"  . Depression   . GERD (gastroesophageal reflux disease)   . High cholesterol   . History of kidney stones    "33 years ago"  . Hypertension   . Irregular heart rhythm   . Pneumonia   . Sleep apnea    wears CPAP   Discharge Diagnoses:   Active Problems:   HNP (herniated nucleus pulposus), lumbar  Estimated body mass index is 36.75 kg/m as calculated from the following:   Height as of this encounter: 6' 3" (1.905 m).   Weight as of this encounter: 133.4 kg (294 lb).  Procedure:  Procedure(s) (LRB): REVISION LUMBAR DECOMPRESSION LUMBAR TWO TO LUMBAR THREE (N/A)   Consults: None  HPI: The patient is a 57 year old male who presented to the office 2 weeks post op from a lumbar laminectomy and microdiscectomy L2-L3. He initially did well postoperatively but he developed increased pain and decreased strength in the right LE. He was seen in the office by. Dr. Tonita Cong and the decision was made to return to the OR for revision decompression and microdiscectomy.    Laboratory Data: Admission on 05/16/2017, Discharged on 05/18/2017  Component Date Value Ref Range Status  . Sodium 05/16/2017 138  135 - 145 mmol/L Final  . Potassium 05/16/2017 4.1  3.5 - 5.1 mmol/L Final  . Chloride 05/16/2017 104  101 - 111 mmol/L Final  . CO2 05/16/2017 24  22 - 32 mmol/L Final  . Glucose, Bld 05/16/2017 105* 65 - 99 mg/dL Final  . BUN 05/16/2017 16  6 - 20 mg/dL Final  . Creatinine, Ser 05/16/2017 1.13  0.61 - 1.24 mg/dL Final  . Calcium 05/16/2017 9.2  8.9 - 10.3 mg/dL Final  . GFR calc non Af Amer 05/16/2017 >60  >60 mL/min Final  . GFR calc Af Amer 05/16/2017 >60  >60 mL/min Final   Comment:  (NOTE) The eGFR has been calculated using the CKD EPI equation. This calculation has not been validated in all clinical situations. eGFR's persistently <60 mL/min signify possible Chronic Kidney Disease.   Georgiann Hahn gap 05/16/2017 10  5 - 15 Final   Performed at [facility], [place] [address] [place], [place] [address]  . WBC 05/16/2017 7.6  4.0 - 10.5 K/uL Final  . RBC 05/16/2017 4.87  4.22 - 5.81 MIL/uL Final  . Hemoglobin 05/16/2017 14.8  13.0 - 17.0 g/dL Final  . HCT 05/16/2017 43.1  39.0 - 52.0 % Final  . MCV 05/16/2017 88.5  78.0 - 100.0 fL Final  . MCH 05/16/2017 30.4  26.0 - 34.0 pg Final  . MCHC 05/16/2017 34.3  30.0 - 36.0 g/dL Final  . RDW 05/16/2017 13.2  11.5 - 15.5 % Final  . Platelets 05/16/2017 263  150 - 400 K/uL Final  . Neutrophils Relative % 05/16/2017 49  % Final  . Neutro Abs 05/16/2017 3.8  1.7 - 7.7 K/uL Final  . Lymphocytes Relative 05/16/2017 36  % Final  . Lymphs Abs 05/16/2017 2.8  0.7 - 4.0 K/uL Final  . Monocytes Relative 05/16/2017 10  % Final  . Monocytes Absolute 05/16/2017 0.8  0.1 - 1.0 K/uL Final  . Eosinophils Relative 05/16/2017 4  % Final  .  Eosinophils Absolute 05/16/2017 0.3  0.0 - 0.7 K/uL Final  . Basophils Relative 05/16/2017 1  % Final  . Basophils Absolute 05/16/2017 0.0  0.0 - 0.1 K/uL Final   Performed at [facility], [place] [address], [place], [place] [address]  . aPTT 05/16/2017 31  24 - 36 seconds Final   Performed at [facility], [place] [address], [place], [place] [address]  . Prothrombin Time 05/16/2017 13.0  11.4 - 15.2 seconds Final  . INR 05/16/2017 0.99   Final   Performed at [facility], [place] [address], [place], [place] [address]  . ABO/RH(D) 05/16/2017 O NEG   Final  . Antibody Screen 05/16/2017 NEG   Final  . Sample Expiration 05/16/2017    Final                   Value:05/19/2017 Performed at [facility], [place] [address],  [place], [place] [address]   . ABO/RH(D) 05/16/2017    Final                   Value:O NEG Performed at [facility], [place] [address] [place], [place] [address]   Hospital Outpatient Visit on 04/30/2017  Component Date Value Ref Range Status  . MRSA, PCR 04/30/2017 NEGATIVE  NEGATIVE Final  . Staphylococcus aureus 04/30/2017 POSITIVE* NEGATIVE Final   Comment: (NOTE) The Xpert SA Assay (FDA approved for NASAL specimens in patients 76 years of age and older), is one component of a comprehensive surveillance program. It is not intended to diagnose infection nor to guide or monitor treatment. Performed at [facility], [place] [address], [place], [place] [address]   . Sodium 04/30/2017 138  135 - 145 mmol/L Final  . Potassium 04/30/2017 4.4  3.5 - 5.1 mmol/L Final  . Chloride 04/30/2017 107  101 - 111 mmol/L Final  . CO2 04/30/2017 20* 22 - 32 mmol/L Final  . Glucose, Bld 04/30/2017 100* 65 - 99 mg/dL Final  . BUN 04/30/2017 12  6 - 20 mg/dL Final  . Creatinine, Ser 04/30/2017 1.02  0.61 - 1.24 mg/dL Final  . Calcium 04/30/2017 9.3  8.9 - 10.3 mg/dL Final  . GFR calc non Af Amer 04/30/2017 >60  >60 mL/min Final  . GFR calc Af Amer 04/30/2017 >60  >60 mL/min Final   Comment: (NOTE) The eGFR has been calculated using the CKD EPI equation. This calculation has not been validated in all clinical situations. eGFR's persistently <60 mL/min signify possible Chronic Kidney Disease.   Georgiann Hahn gap 04/30/2017 11  5 - 15 Final   Performed at [facility], [place] [address], [place], [place] [address]  . WBC 04/30/2017 7.1  4.0 - 10.5 K/uL Final  . RBC 04/30/2017 4.98  4.22 - 5.81 MIL/uL Final  . Hemoglobin 04/30/2017 15.0  13.0 - 17.0 g/dL Final  . HCT 04/30/2017 43.8  39.0 - 52.0 % Final  . MCV 04/30/2017 88.0  78.0 - 100.0 fL Final  . MCH 04/30/2017 30.1  26.0 - 34.0 pg Final  . MCHC 04/30/2017 34.2  30.0 - 36.0 g/dL Final  . RDW 04/30/2017 13.6  11.5 - 15.5 % Final    . Platelets 04/30/2017 191  150 - 400 K/uL Final   Performed at [facility], [place] [address], [place], [place] [address]     X-Rays:Dg Lumbar Spine 2-3 Views  Result Date: 05/08/2017 CLINICAL DATA:  Intraoperative localization EXAM: LUMBAR SPINE - 2-3 VIEW COMPARISON:  04/30/2017 FINDINGS: Three images were obtained intraoperatively during lumbar surgery. The initial image shows needles within the posterior soft tissues at the level of the L2 and L3 spinous processes. Subsequent image demonstrates surgical retractors with instruments at the L2-3 and L3-4 level. Subsequent image demonstrates surgical instrument within the spinal canal just above the L2-3 interspace. IMPRESSION: Intraoperative lumbar localization as described. Electronically Signed   By: Mark  Lukens M.D.   On: 05/08/2017 14:40   Dg Lumbar Spine 2-3 Views  Result Date: 04/30/2017 CLINICAL DATA:  Degenerative disc disease. EXAM: LUMBAR SPINE - 2-3 VIEW COMPARISON:  Lumbar spine x-rays dated February 12, 2017. FINDINGS: Five lumbar type vertebral bodies. No acute fracture or subluxation. Vertebral body heights are preserved. Slightly increased grade 1 anterolisthesis at L5-S1 due to bilateral L5 pars defects. Moderate disc height loss at L5-S1, unchanged. Mild disc height loss at L1-L2 and L2-L3. The sacroiliac joints are intact. IMPRESSION: 1. Slightly increased grade 1 anterolisthesis at L5-S1 due to bilateral L5 pars defects. 2. Moderate degenerative disc disease at L5-S1, unchanged. Electronically Signed   By: William T Derry M.D.   On: 04/30/2017 16:36   Dg Spine Portable 1 View  Result Date: 05/16/2017 CLINICAL DATA:  Lumbar laminectomy and microdiscectomy. EXAM: PORTABLE SPINE - 1 VIEW COMPARISON:  None. FINDINGS: Intraoperative radiograph labeled #2 shows posterior surgical instruments, with a blunt probe overlying the spinal canal at the level of the L2-L3 intervertebral disc space. IMPRESSION: Intraoperative localization of  L2-3. Electronically Signed   By: John  Stahl M.D.   On: 05/16/2017 21:33   Dg Spine Portable 1 View  Result Date: 05/16/2017 CLINICAL DATA:  Surgical level L2-3. Please label spinous processes. EXAM: PORTABLE SPINE - 1 VIEW COMPARISON:  None. FINDINGS: A lateral intraoperative view of the lumbar spine demonstrates a localizing probe at the L2 level just caudal to the pedicle. Moderate-to-marked disc space narrowing at L5-S1 with anterolisthesis of L5 on S1, grade 1 of approximately 14 mm. IMPRESSION: Localizing probe at the L2 level. Grade 1 anterolisthesis L5 on S1 with moderate-to-marked disc space narrowing. Electronically Signed   By: David  Kwon M.D.   On: 05/16/2017 20:49    EKG: Orders placed or performed during the hospital encounter of 04/30/17  . EKG 12 lead  . EKG 12 lead     Hospital Course: [name] is a 56 y.o. who was admitted to [facility]. They were brought to the operating room on 05/16/2017 and underwent Procedure(s): REVISION LUMBAR DECOMPRESSION LUMBAR TWO TO LUMBAR THREE.  Patient tolerated the procedure well and was later transferred to the recovery room and then to the orthopaedic floor for postoperative care.  They were given PO and IV analgesics for pain control following their surgery.  They were given 24 hours of postoperative antibiotics of  Anti-infectives (From admission, onward)   Start     Dose/Rate Route Frequency Ordered Stop   05/17/17 0600  ceFAZolin (ANCEF) 3 g in dextrose 5 % 50 mL IVPB  Status:  Discontinued     3 g 130 mL/hr over 30 Minutes Intravenous On call to O.R. 05/16/17 1738 05/16/17 1802   05/17/17 0400  ceFAZolin (ANCEF) 3 g in dextrose 5 % 50 mL IVPB     3 g 130 mL/hr over 30 Minutes Intravenous Every 8 hours 05/16/17 2345 05/17/17 2000   05/16/17 2021  polymyxin B 500,000 Units, bacitracin 50,000 Units in sodium chloride 0.9 %   500 mL irrigation  Status:  Discontinued       As needed 05/16/17 2021 05/16/17 2204   05/16/17  1800  ceFAZolin (ANCEF) 3 g in dextrose 5 % 50 mL IVPB     3 g 130 mL/hr over 30 Minutes Intravenous On call to O.R. 05/16/17 1802 05/16/17 1945    PT was ordered. Discharge planning consulted to help with postop disposition and equipment needs.  Patient had a good night on the evening of surgery.  They started to get up OOB with therapy on day one. Dressing was changed and the incision was clean and dry.  The patient had progressed with therapy and meeting their goals.  Incision was healing well.  Patient was seen in rounds and was ready to go home.   Diet: Cardiac diet Activity:WBAT Follow-up:in 2 weeks Disposition - Home Discharged Condition: stable   Discharge Instructions    Call MD / Call 911   Complete by:  As directed    If you experience chest pain or shortness of breath, CALL 911 and be transported to the hospital emergency room.  If you develope a fever above 101 F, pus (white drainage) or increased drainage or redness at the wound, or calf pain, call your surgeon's office.   Constipation Prevention   Complete by:  As directed    Drink plenty of fluids.  Prune juice may be helpful.  You may use a stool softener, such as Colace (over the counter) 100 mg twice a day.  Use MiraLax (over the counter) for constipation as needed.   Diet - low sodium heart healthy   Complete by:  As directed    Discharge instructions   Complete by:  As directed    WBAT with walker No lifting or excessive bending No driving while taking pain medications Dressing is waterproof Do not remove dressing before post operative appointment unless it appears compromised.  Follow up with Dr. Beane 05/22/17   Increase activity slowly as tolerated   Complete by:  As directed      Allergies as of 05/18/2017      Reactions   Iodinated Diagnostic Agents Palpitations, Hypertension   Iohexol Palpitations, Hypertension   Increased blood pressure       Medication List    STOP taking these medications     HYDROcodone-acetaminophen 5-325 MG tablet Commonly known as:  NORCO/VICODIN   ibuprofen 200 MG tablet Commonly known as:  ADVIL,MOTRIN   meloxicam 7.5 MG tablet Commonly known as:  MOBIC   multivitamin with minerals Tabs tablet     TAKE these medications   aspirin EC 81 MG tablet Take 1 tablet (81 mg total) by mouth daily. Resume 4 days post-op   bismuth subsalicylate 262 MG/15ML suspension Commonly known as:  PEPTO BISMOL Take 30 mLs by mouth daily as needed for indigestion or diarrhea or loose stools.   diltiazem 120 MG 24 hr capsule Commonly known as:  CARDIZEM CD Take 1 capsule (120 mg total) by mouth daily.   docusate sodium 100 MG capsule Commonly known as:  COLACE Take 1 capsule (100 mg total) by mouth 2 (two) times daily.   escitalopram 20 MG tablet Commonly known as:  LEXAPRO Take 20 mg by mouth daily.   gabapentin 300 MG capsule Commonly known as:  NEURONTIN Take 1 capsule (300 mg total) by mouth 3 (three) times daily.   methocarbamol 500 MG tablet Commonly known as:  ROBAXIN Take 1 tablet (500 mg total) by mouth every   6 (six) hours as needed for muscle spasms.   Na Sulfate-K Sulfate-Mg Sulf 17.5-3.13-1.6 GM/177ML Soln Commonly known as:  SUPREP BOWEL PREP KIT Take 1 kit by mouth as directed.   oxyCODONE 5 MG immediate release tablet Commonly known as:  ROXICODONE Take 1-2 tablets (5-10 mg total) by mouth every 4 (four) hours as needed. What changed:  Another medication with the same name was added. Make sure you understand how and when to take each.   oxyCODONE 5 MG immediate release tablet Commonly known as:  Oxy IR/ROXICODONE Take 1 tablet (5 mg total) by mouth every 4 (four) hours as needed for moderate pain ((score 4 to 6)). What changed:  You were already taking a medication with the same name, and this prescription was added. Make sure you understand how and when to take each.   polyethylene glycol packet Commonly known as:  MIRALAX /  GLYCOLAX Take 17 g by mouth daily as needed for mild constipation.   polyethylene glycol-electrolytes 420 g solution Commonly known as:  TRILYTE Take 4,000 mLs by mouth as directed.   quinapril 20 MG tablet Commonly known as:  ACCUPRIL Take 20 mg by mouth daily.   tamsulosin 0.4 MG Caps capsule Commonly known as:  FLOMAX Take 1 capsule (0.4 mg total) by mouth daily. Rx sent to pharmacy from office Friday      Follow-up Information    Susa Day, MD. Schedule an appointment as soon as possible for a visit on 05/22/2017.   Specialty:  Orthopedic Surgery Contact information: [address] [place] [address] [phone]           Signed: Ardeen Jourdain, PA-C Orthopaedic Surgery 05/19/2017, 1:47 PM

## 2017-06-27 ENCOUNTER — Other Ambulatory Visit (HOSPITAL_COMMUNITY): Payer: Self-pay | Admitting: Specialist

## 2017-06-27 ENCOUNTER — Ambulatory Visit (HOSPITAL_COMMUNITY)
Admission: RE | Admit: 2017-06-27 | Discharge: 2017-06-27 | Disposition: A | Discharge status: Home / Self Care | Payer: No Typology Code available for payment source | Source: Ambulatory Visit | Attending: Specialist | Admitting: Specialist

## 2017-06-27 DIAGNOSIS — M79605 Pain in left leg: Secondary | ICD-10-CM | POA: Insufficient documentation

## 2017-06-27 DIAGNOSIS — M7989 Other specified soft tissue disorders: Secondary | ICD-10-CM | POA: Diagnosis not present

## 2017-06-27 DIAGNOSIS — M79604 Pain in right leg: Secondary | ICD-10-CM | POA: Diagnosis not present

## 2017-06-27 DIAGNOSIS — M79661 Pain in right lower leg: Secondary | ICD-10-CM | POA: Diagnosis not present

## 2017-06-27 DIAGNOSIS — M79662 Pain in left lower leg: Secondary | ICD-10-CM | POA: Insufficient documentation

## 2017-06-27 NOTE — Progress Notes (Signed)
Right lower extremity venous duplex has been completed. Negative for DVT. There is evidence of superficial vein thrombosis involving the greater saphenous vein of the right lower extremity.  Results were given to [facility] PA at Dr. Ermelinda Das office.  06/27/17 3:25 PM Olen Cordial RVT

## 2017-08-05 DIAGNOSIS — M419 Scoliosis, unspecified: Secondary | ICD-10-CM | POA: Insufficient documentation

## 2017-08-26 ENCOUNTER — Other Ambulatory Visit: Payer: Self-pay

## 2017-08-26 ENCOUNTER — Encounter (HOSPITAL_COMMUNITY): Payer: Self-pay | Admitting: Emergency Medicine

## 2017-08-26 ENCOUNTER — Emergency Department (HOSPITAL_COMMUNITY): Payer: 59

## 2017-08-26 ENCOUNTER — Observation Stay (HOSPITAL_COMMUNITY): Payer: 59

## 2017-08-26 ENCOUNTER — Observation Stay (HOSPITAL_COMMUNITY)
Admission: EM | Admit: 2017-08-26 | Discharge: 2017-08-27 | Disposition: A | Discharge status: Home / Self Care | Payer: 59 | Attending: Internal Medicine | Admitting: Internal Medicine

## 2017-08-26 DIAGNOSIS — G8929 Other chronic pain: Secondary | ICD-10-CM

## 2017-08-26 DIAGNOSIS — R079 Chest pain, unspecified: Principal | ICD-10-CM | POA: Insufficient documentation

## 2017-08-26 DIAGNOSIS — M549 Dorsalgia, unspecified: Secondary | ICD-10-CM

## 2017-08-26 DIAGNOSIS — M5489 Other dorsalgia: Secondary | ICD-10-CM | POA: Insufficient documentation

## 2017-08-26 DIAGNOSIS — Z79899 Other long term (current) drug therapy: Secondary | ICD-10-CM | POA: Insufficient documentation

## 2017-08-26 DIAGNOSIS — Z8679 Personal history of other diseases of the circulatory system: Secondary | ICD-10-CM | POA: Diagnosis not present

## 2017-08-26 DIAGNOSIS — Z86718 Personal history of other venous thrombosis and embolism: Secondary | ICD-10-CM | POA: Insufficient documentation

## 2017-08-26 DIAGNOSIS — I10 Essential (primary) hypertension: Secondary | ICD-10-CM | POA: Diagnosis not present

## 2017-08-26 DIAGNOSIS — Z7982 Long term (current) use of aspirin: Secondary | ICD-10-CM | POA: Diagnosis not present

## 2017-08-26 HISTORY — DX: Cardiac arrhythmia, unspecified: I49.9

## 2017-08-26 LAB — TROPONIN I
Troponin I: <0.03 ng/mL (ref <0.03)
Troponin I: <0.03 ng/mL (ref <0.03)
Troponin I: <0.03 ng/mL (ref <0.03)

## 2017-08-26 LAB — CBC
HCT: 44.4 % (ref 39.0–52.0)
Hemoglobin: 15.3 g/dL (ref 13.0–17.0)
MCH: 29.9 pg (ref 26.0–34.0)
MCHC: 34.5 g/dL (ref 30.0–36.0)
MCV: 86.9 fL (ref 78.0–100.0)
Platelets: 135 10*3/uL — ABNORMAL LOW (ref 150–400)
RBC: 5.11 MIL/uL (ref 4.22–5.81)
RDW: 13.1 % (ref 11.5–15.5)
WBC: 7 10*3/uL (ref 4.0–10.5)

## 2017-08-26 LAB — BASIC METABOLIC PANEL
Anion gap: 9 (ref 5–15)
BUN: 15 mg/dL (ref 6–20)
CO2: 19 mmol/L — ABNORMAL LOW (ref 22–32)
Calcium: 8.7 mg/dL — ABNORMAL LOW (ref 8.9–10.3)
Chloride: 108 mmol/L (ref 98–111)
Creatinine, Ser: 1.12 mg/dL (ref 0.61–1.24)
GFR calc Af Amer: >60 mL/min (ref >60)
GFR calc non Af Amer: >60 mL/min (ref >60)
Glucose, Bld: 138 mg/dL — ABNORMAL HIGH (ref 70–99)
Potassium: 3.8 mmol/L (ref 3.5–5.1)
Sodium: 136 mmol/L (ref 135–145)

## 2017-08-26 MED ORDER — POLYETHYLENE GLYCOL 3350 17 G PO PACK: 17.0000 g | PACK | Freq: Every day | ORAL | Status: DC | PRN

## 2017-08-26 MED ORDER — METHOCARBAMOL 500 MG PO TABS
500.0000 mg | ORAL_TABLET | Freq: Every morning | ORAL | Status: DC
  Administered 2017-08-27: 500 mg via ORAL
  Filled 2017-08-26: qty 1

## 2017-08-26 MED ORDER — QUINAPRIL HCL 10 MG PO TABS
20.0000 mg | ORAL_TABLET | Freq: Every morning | ORAL | Status: DC
  Administered 2017-08-27: 20 mg via ORAL
  Filled 2017-08-26 (×3): qty 2

## 2017-08-26 MED ORDER — ENOXAPARIN SODIUM 40 MG/0.4ML ~~LOC~~ SOLN
40.0000 mg | SUBCUTANEOUS | Status: DC
  Administered 2017-08-26: 40 mg via SUBCUTANEOUS
  Filled 2017-08-26: qty 0.4

## 2017-08-26 MED ORDER — ASPIRIN EC 81 MG PO TBEC
81.0000 mg | DELAYED_RELEASE_TABLET | Freq: Every morning | ORAL | Status: DC
  Administered 2017-08-27: 81 mg via ORAL
  Filled 2017-08-26: qty 1

## 2017-08-26 MED ORDER — ACETAMINOPHEN 325 MG PO TABS: 650.0000 mg | ORAL_TABLET | ORAL | Status: DC | PRN

## 2017-08-26 MED ORDER — MORPHINE SULFATE (PF) 2 MG/ML IV SOLN: 2.0000 mg | INTRAVENOUS | Status: DC | PRN

## 2017-08-26 MED ORDER — TECHNETIUM TO 99M ALBUMIN AGGREGATED
4.0000 | Freq: Once | INTRAVENOUS | Status: AC | PRN
  Administered 2017-08-26: 4.4 via INTRAVENOUS

## 2017-08-26 MED ORDER — DILTIAZEM HCL ER COATED BEADS 120 MG PO CP24
120.0000 mg | ORAL_CAPSULE | Freq: Every morning | ORAL | Status: DC
  Administered 2017-08-27: 120 mg via ORAL
  Filled 2017-08-26: qty 1

## 2017-08-26 MED ORDER — GI COCKTAIL ~~LOC~~: 30.0000 mL | Freq: Four times a day (QID) | ORAL | Status: DC | PRN

## 2017-08-26 MED ORDER — GABAPENTIN 300 MG PO CAPS
900.0000 mg | ORAL_CAPSULE | Freq: Three times a day (TID) | ORAL | Status: DC
  Administered 2017-08-26 – 2017-08-27 (×2): 900 mg via ORAL
  Filled 2017-08-26 (×2): qty 3

## 2017-08-26 MED ORDER — DOCUSATE SODIUM 100 MG PO CAPS: 100.0000 mg | ORAL_CAPSULE | Freq: Two times a day (BID) | ORAL | Status: DC | PRN

## 2017-08-26 MED ORDER — TECHNETIUM TC 99M DIETHYLENETRIAME-PENTAACETIC ACID
30.0000 | Freq: Once | INTRAVENOUS | Status: AC | PRN
  Administered 2017-08-26: 33 via RESPIRATORY_TRACT

## 2017-08-26 MED ORDER — NITROGLYCERIN 0.4 MG SL SUBL: 0.4000 mg | SUBLINGUAL_TABLET | SUBLINGUAL | Status: DC | PRN

## 2017-08-26 MED ORDER — ONDANSETRON HCL 4 MG/2ML IJ SOLN: 4.0000 mg | Freq: Four times a day (QID) | INTRAMUSCULAR | Status: DC | PRN

## 2017-08-26 MED ORDER — ASPIRIN 81 MG PO CHEW
324.0000 mg | CHEWABLE_TABLET | Freq: Once | ORAL | Status: AC
  Administered 2017-08-26: 324 mg via ORAL
  Filled 2017-08-26: qty 4

## 2017-08-26 MED ORDER — ESCITALOPRAM OXALATE 10 MG PO TABS
20.0000 mg | ORAL_TABLET | Freq: Every morning | ORAL | Status: DC
  Administered 2017-08-27: 20 mg via ORAL
  Filled 2017-08-26: qty 2

## 2017-08-26 MED ORDER — NITROGLYCERIN 0.4 MG SL SUBL
0.4000 mg | SUBLINGUAL_TABLET | Freq: Once | SUBLINGUAL | Status: AC
  Administered 2017-08-26: 0.4 mg via SUBLINGUAL
  Filled 2017-08-26: qty 1

## 2017-08-26 NOTE — ED Provider Notes (Signed)
[facility] EMERGENCY DEPARTMENT Provider Note   CSN: [id number] Arrival date & time: 08/26/17  1346     History   Chief Complaint Chief Complaint  Patient presents with  . Chest Pain    HPI [name] is a 57 y.o. male.  HPI   57 year old male with chest pain.  Symptom onset approximately 1:00 while at rest sitting in his recliner.  Describes deep pressure in the center left of his chest into his left shoulder and left arm.  Associated with dyspnea and nausea.  Pain has improved since onset but still present.  He was in his usual state of health earlier in the day.  Denies any known history of CAD. Says he has previously seen a cardiologist for irregular heart beat but now in several years and cannot remember who.   Past Medical History:  Diagnosis Date  . Arthritis    "right knee"  . Depression   . GERD (gastroesophageal reflux disease)   . High cholesterol   . History of kidney stones    "33 years ago"  . Hypertension   . Irregular heart rhythm   . Pneumonia   . Sleep apnea    wears CPAP    Patient Active Problem List   Diagnosis Date Noted  . HNP (herniated nucleus pulposus), lumbar 05/08/2017  . Essential hypertension 10/15/2014  . Hyperlipidemia 10/15/2014  . Obstructive sleep apnea 10/15/2014  . Memory loss 10/14/2014  . Depression 10/14/2014  . Chest pain 05/15/2012  . PVC (premature ventricular contraction) 05/15/2012  . CARPAL TUNNEL SYNDROME 12/14/2007    Past Surgical History:  Procedure Laterality Date  . HERNIA REPAIR    . KNEE SURGERY Right    x2  . LUMBAR LAMINECTOMY/DECOMPRESSION MICRODISCECTOMY N/A 05/08/2017   Procedure: Microlumbar decompression Lumbar two-three;  Surgeon: Susa Day, MD;  Location: [place];  Service: Orthopedics;  Laterality: N/A;  . LUMBAR LAMINECTOMY/DECOMPRESSION MICRODISCECTOMY N/A 05/16/2017   Procedure: REVISION LUMBAR DECOMPRESSION LUMBAR TWO TO LUMBAR THREE;  Surgeon: Susa Day, MD;  Location: [facility];   Service: Orthopedics;  Laterality: N/A;        Home Medications    Prior to Admission medications   Medication Sig Start Date End Date Taking? Authorizing Provider  aspirin EC 81 MG tablet Take 1 tablet (81 mg total) by mouth daily. Resume 4 days post-op 05/16/17   Cecilie Kicks, PA-C  bismuth subsalicylate (PEPTO BISMOL) 262 MG/15ML suspension Take 30 mLs by mouth daily as needed for indigestion or diarrhea or loose stools.    [provider]  diltiazem (CARDIZEM CD) 120 MG 24 hr capsule Take 1 capsule (120 mg total) by mouth daily. 01/14/14   Josue Hector, MD  docusate sodium (COLACE) 100 MG capsule Take 1 capsule (100 mg total) by mouth 2 (two) times daily. 05/09/17   Cecilie Kicks, PA-C  escitalopram (LEXAPRO) 20 MG tablet Take 20 mg by mouth daily.    [provider]  gabapentin (NEURONTIN) 300 MG capsule Take 1 capsule (300 mg total) by mouth 3 (three) times daily. 05/09/17   Cecilie Kicks, PA-C  methocarbamol (ROBAXIN) 500 MG tablet Take 1 tablet (500 mg total) by mouth every 6 (six) hours as needed for muscle spasms. 05/09/17   Cecilie Kicks, PA-C  Na Sulfate-K Sulfate-Mg Sulf (SUPREP BOWEL PREP KIT) 17.5-3.13-1.6 GM/177ML SOLN Take 1 kit by mouth as directed. Patient not taking: Reported on 05/16/2017 02/05/17   Annitta Needs, NP  oxyCODONE (OXY  IR/ROXICODONE) 5 MG immediate release tablet Take 1 tablet (5 mg total) by mouth every 4 (four) hours as needed for moderate pain ((score 4 to 6)). 05/18/17   Constable, Amber, PA-C  oxyCODONE (ROXICODONE) 5 MG immediate release tablet Take 1-2 tablets (5-10 mg total) by mouth every 4 (four) hours as needed. 05/09/17 05/09/18  Cecilie Kicks, PA-C  polyethylene glycol (MIRALAX / GLYCOLAX) packet Take 17 g by mouth daily as needed for mild constipation. 05/09/17   Cecilie Kicks, PA-C  polyethylene glycol-electrolytes (TRILYTE) 420 g solution Take 4,000 mLs by mouth as directed. Patient not taking: Reported on  05/16/2017 02/10/17   Annitta Needs, NP  quinapril (ACCUPRIL) 20 MG tablet Take 20 mg by mouth daily.     [provider]  tamsulosin (FLOMAX) 0.4 MG CAPS capsule Take 1 capsule (0.4 mg total) by mouth daily. Rx sent to pharmacy from office Friday 05/17/17   Cecilie Kicks, PA-C    Family History Family History  Problem Relation Age of Onset  . Coronary artery disease Father   . Diabetes Father   . Heart disease Mother     Social History Social History   Tobacco Use  . Smoking status: Never Smoker  . Smokeless tobacco: Never Used  Substance Use Topics  . Alcohol use: Yes    Alcohol/week: 0.0 oz    Comment: 3-4 beers a month "if that"  . Drug use: No     Allergies   Iodinated diagnostic agents and Iohexol   Review of Systems Review of Systems  All systems reviewed and negative, other than as noted in HPI.  Physical Exam Updated Vital Signs BP (!) 150/96   Pulse 81   Temp 97.8 F (36.6 C) (Temporal)   Resp (!) 28   Ht _0  (1.905 m)   Wt 131.5 kg (290 lb)   SpO2 96%   BMI 36.25 kg/m    Physical Exam  Constitutional: He appears well-developed and well-nourished. No distress.  Laying in Biomedical scientist.  Appears somewhat uncomfortable.  HENT:  Head: Normocephalic and atraumatic.  Eyes: Conjunctivae are normal. Right eye exhibits no discharge. Left eye exhibits no discharge.  Neck: Neck supple.  Cardiovascular: Normal rate, regular rhythm and normal heart sounds. Exam reveals no gallop and no friction rub.  No murmur heard. Pulmonary/Chest: Effort normal and breath sounds normal. No respiratory distress.  Abdominal: Soft. He exhibits no distension. There is no tenderness.  Musculoskeletal: He exhibits no edema or tenderness.  Neurological: He is alert.  Skin: Skin is warm and dry.  Psychiatric: He has a normal mood and affect. His behavior is normal. Thought content normal.  Nursing note and vitals reviewed.    ED Treatments / Results  Labs (all  labs ordered are listed, but only abnormal results are displayed) Labs Reviewed  BASIC METABOLIC PANEL - Abnormal; Notable for the following components:      Result Value   CO2 19 (*)    Glucose, Bld 138 (*)    Calcium 8.7 (*)    All other components within normal limits  CBC - Abnormal; Notable for the following components:   Platelets 135 (*)    All other components within normal limits  TROPONIN I    EKG EKG Interpretation  Date/Time:  Tuesday August 26 2017 13:49:25 EDT Ventricular Rate:  73 PR Interval:  154 QRS Duration: 92 QT Interval:  370 QTC Calculation: 407 R Axis:   42 Text Interpretation:  Normal sinus rhythm  Normal ECG poor ecg 2/2 artifact and baseline wander making interpretation difficult no obvious changes since march 27 Confirmed by Merrily Pew [phone]) on 08/26/2017 1:53:43 PM   Radiology Dg Chest 2 View  Result Date: 08/26/2017 CLINICAL DATA:  Mid chest pain radiating into the left arm began 2 hours prior to presentation. There was associated shortness of breath and nausea and diaphoresis. Nonsmoker. EXAM: CHEST - 2 VIEW COMPARISON:  Chest x-ray of May 10, 2012 FINDINGS: The lungs are well-expanded. The interstitial markings are coarse and slightly more conspicuous today. The heart and pulmonary vascularity are normal. The mediastinum is normal in width. The trachea is midline. The bony thorax exhibits no acute abnormality. IMPRESSION: Mild bilateral interstitial prominence likely reflects bronchitic changes. No alveolar pneumonia nor pulmonary edema. Electronically Signed   By: David  Martinique M.D.   On: 08/26/2017 14:37    Procedures Procedures (including critical care time)  Medications Ordered in ED Medications  aspirin chewable tablet 324 mg (has no administration in time range)  nitroGLYCERIN (NITROSTAT) SL tablet 0.4 mg (has no administration in time range)     Initial Impression / Assessment and Plan / ED Course  I have reviewed the triage vital  signs and the nursing notes.  Pertinent labs & imaging results that were available during my care of the patient were reviewed by me and considered in my medical decision making (see chart for details).     57 year old male with chest pain.  Symptoms concerning for possible ACS.  EKG without overt ischemic changes and appears relatively stable to priors.  Given aspirin.  Will try Nitroglycerin while work-up is been completed.  Work-up pretty unremarkable.  He is now pain-free.  Initial troponin is normal, but symptoms started shortly before arrival.  Symptoms are more typical of ACS.  Will admit for rule out.  Final Clinical Impressions(s) / ED Diagnoses   Final diagnoses:  Chest pain, unspecified type    ED Discharge Orders    None      Virgel Manifold, MD 08/26/17 1512

## 2017-08-26 NOTE — Progress Notes (Signed)
Patient requested something to eat if possible. NPO order noted until after stress test resulted. Text paged MD to see if patient could eat and be NPO after midnight. Patient also states he uses CPAP every night at home. Text paged MD to notify to see if it could be ordered for RT to arrange. Earnstine RegalAshley Mistee Soliman, RN

## 2017-08-26 NOTE — ED Notes (Signed)
Pt states pain is better and does not want another nitro

## 2017-08-26 NOTE — H&P (Signed)
H&P        History and Physical    [name] DOB: 12-07-1960 DOA: 08/26/2017  PCP: Redmond School, MD  Patient coming from: Home  I have personally briefly reviewed patient's old medical records in [place]  Chief Complaint: CP  HPI: [name] is a 57 y.o. male with medical history significant of hypertension presents with chest pain.  Earlier today patient had abdominal pain in the upper right region.  It radiated over to his sternum and then down his left arm.  He started having nausea and diaphoresis which prompted him to come into the ED.  he was given nitro which resolved his discomfort.  He has no known heart disease.  he had back surgery in April and has been recuperating from that.  Diagnosed with a superficial thrombosis in May.  He has been increasing his activity since his surgery.  Father had an MI at age 53.  Denies any cigarette or illegal drug use and only drinks alcohol rarely.  Had a nonremarkable chest x-ray and negative troponin upon admission to the ED.  VQ scan was negative for PE.   ED Course: Reviewed case with Dr. Wilson Singer.  Chest pain resolved with sublingual nitro  Review of Systems: + CP, SOB, nausea, diaphoresis.  All others are reviewed and otherwise negative other than those mentioned above per HPI  Past Medical History:  Diagnosis Date  . Arthritis    "right knee"  . Depression   . Dysrhythmia   . GERD (gastroesophageal reflux disease)   . High cholesterol   . History of kidney stones    "33 years ago"  . Hypertension   . Irregular heart rhythm   . Pneumonia   . Sleep apnea    wears CPAP    Past Surgical History:  Procedure Laterality Date  . BACK SURGERY    . HERNIA REPAIR    . KNEE SURGERY Right    x2  . LUMBAR LAMINECTOMY/DECOMPRESSION MICRODISCECTOMY N/A 05/08/2017   Procedure: Microlumbar decompression Lumbar two-three;  Surgeon: Susa Day, MD;  Location: [place];  Service: Orthopedics;  Laterality:  N/A;  . LUMBAR LAMINECTOMY/DECOMPRESSION MICRODISCECTOMY N/A 05/16/2017   Procedure: REVISION LUMBAR DECOMPRESSION LUMBAR TWO TO LUMBAR THREE;  Surgeon: Susa Day, MD;  Location: [facility];  Service: Orthopedics;  Laterality: N/A;     reports that he has never smoked. He has never used smokeless tobacco. He reports that he drinks alcohol. He reports that he does not use drugs.  Allergies  Allergen Reactions  . Iodinated Diagnostic Agents Palpitations and Hypertension  . Iohexol Palpitations and Hypertension    Increased blood pressure     Family History  Problem Relation Age of Onset  . Coronary artery disease Father   . Diabetes Father   . Heart disease Mother    Unacceptable: Noncontributory, unremarkable, or negative. Acceptable: Family history reviewed and not pertinent (If you reviewed it)  Prior to Admission medications   Medication Sig Start Date End Date Taking? Authorizing Provider  aspirin EC 81 MG tablet Take 1 tablet (81 mg total) by mouth daily. Resume 4 days post-op Patient taking differently: Take 81 mg by mouth every morning.  05/16/17  Yes Lacie Draft M, PA-C  diltiazem (CARDIZEM CD) 120 MG 24 hr capsule Take 1 capsule (120 mg total) by mouth daily. Patient taking differently: Take 120 mg by mouth every morning.  01/14/14  Yes Josue Hector, MD  docusate sodium (COLACE) 100  MG capsule Take 1 capsule (100 mg total) by mouth 2 (two) times daily. Patient taking differently: Take 100 mg by mouth 2 (two) times daily as needed for mild constipation or moderate constipation.  05/09/17  Yes Lacie Draft M, PA-C  escitalopram (LEXAPRO) 20 MG tablet Take 20 mg by mouth every morning.    Yes [provider]  gabapentin (NEURONTIN) 300 MG capsule Take 1 capsule (300 mg total) by mouth 3 (three) times daily. Patient taking differently: Take 900 mg by mouth 3 (three) times daily.  05/09/17  Yes Lacie Draft M, PA-C  meloxicam (MOBIC) 7.5 MG tablet Take 7.5 mg by  mouth every morning.   Yes [provider]  methocarbamol (ROBAXIN) 500 MG tablet Take 1 tablet (500 mg total) by mouth every 6 (six) hours as needed for muscle spasms. Patient taking differently: Take 500 mg by mouth every morning.  05/09/17  Yes Lacie Draft M, PA-C  polyethylene glycol (MIRALAX / GLYCOLAX) packet Take 17 g by mouth daily as needed for mild constipation. 05/09/17  Yes Lacie Draft M, PA-C  quinapril (ACCUPRIL) 20 MG tablet Take 20 mg by mouth every morning.    Yes [provider]  Na Sulfate-K Sulfate-Mg Sulf (SUPREP BOWEL PREP KIT) 17.5-3.13-1.6 GM/177ML SOLN Take 1 kit by mouth as directed. Patient not taking: Reported on 05/16/2017 02/05/17   Annitta Needs, NP  oxyCODONE (OXY IR/ROXICODONE) 5 MG immediate release tablet Take 1 tablet (5 mg total) by mouth every 4 (four) hours as needed for moderate pain ((score 4 to 6)). Patient not taking: Reported on 08/26/2017 05/18/17   Ardeen Jourdain, PA-C  oxyCODONE (ROXICODONE) 5 MG immediate release tablet Take 1-2 tablets (5-10 mg total) by mouth every 4 (four) hours as needed. Patient not taking: Reported on 08/26/2017 05/09/17 05/09/18  Cecilie Kicks, PA-C  polyethylene glycol-electrolytes (TRILYTE) 420 g solution Take 4,000 mLs by mouth as directed. Patient not taking: Reported on 05/16/2017 02/10/17   Annitta Needs, NP  tamsulosin (FLOMAX) 0.4 MG CAPS capsule Take 1 capsule (0.4 mg total) by mouth daily. Rx sent to pharmacy from office Friday Patient not taking: Reported on 08/26/2017 05/17/17   Cecilie Kicks, [place]    Physical Exam: Vitals:   08/26/17 1626 08/26/17 1630 08/26/17 1700 08/26/17 1902  BP: (!) 124/48 119/70 (!) 114/49 119/79  Pulse: (!) 51 (!) 52 (!) 51 (!) 51  Resp: _0 Temp:    98.2 F (36.8 C)  TempSrc:    Oral  SpO2: 98% 90% 96% 97%  Weight:      Height:        Constitutional: NAD, calm, comfortable Vitals:   08/26/17 1626 08/26/17 1630 08/26/17 1700 08/26/17 1902  BP: (!)  124/48 119/70 (!) 114/49 119/79  Pulse: (!) 51 (!) 52 (!) 51 (!) 51  Resp: _1 Temp:    98.2 F (36.8 C)  TempSrc:    Oral  SpO2: 98% 90% 96% 97%  Weight:      Height:       Eyes: PERRL, lids and conjunctivae normal ENMT: Mucous membranes are moist. Posterior pharynx clear of any exudate or lesions.Normal dentition.  Neck: normal, supple, no masses, no thyromegaly Respiratory: clear to auscultation bilaterally, no wheezing, no crackles. Normal respiratory effort. No accessory muscle use.  Cardiovascular: Regular rate and rhythm, no murmurs / rubs / gallops. No extremity edema. 2+ pedal pulses. No carotid bruits.  Abdomen: no tenderness, no masses  palpated.  Bowel sounds positive.  Musculoskeletal: no clubbing / cyanosis. No joint deformity upper and lower extremities.  Skin: no rashes, lesions, ulcers. No induration Neurologic: CN 2-12 grossly intact. Sensation intact, DTR normal. Strength 5/5 in all 4.  Psychiatric: Normal judgment and insight. Alert and oriented x 3. Normal mood.    Labs on Admission: I have personally reviewed following labs and imaging studies  CBC: Recent Labs  Lab 08/26/17 1405  WBC 7.0  HGB 15.3  HCT 44.4  MCV 86.9  PLT 409*   Basic Metabolic Panel: Recent Labs  Lab 08/26/17 1405  NA 136  K 3.8  CL 108  CO2 19*  GLUCOSE 138*  BUN 15  CREATININE 1.12  CALCIUM 8.7*   GFR: Estimated Creatinine Clearance: 110.2 mL/min (by C-G formula based on SCr of 1.12 mg/dL). Liver Function Tests: No results for input(s): AST, ALT, ALKPHOS, BILITOT, PROT, ALBUMIN in the last 168 hours. No results for input(s): LIPASE, AMYLASE in the last 168 hours. No results for input(s): AMMONIA in the last 168 hours. Coagulation Profile: No results for input(s): INR, PROTIME in the last 168 hours. Cardiac Enzymes: Recent Labs  Lab 08/26/17 1405 08/26/17 1703  TROPONINI <0.03 <0.03   BNP (last 3 results) No results for input(s): PROBNP in the last 8760  hours. HbA1C: No results for input(s): HGBA1C in the last 72 hours. CBG: No results for input(s): GLUCAP in the last 168 hours. Lipid Profile: No results for input(s): CHOL, HDL, LDLCALC, TRIG, CHOLHDL, LDLDIRECT in the last 72 hours. Thyroid Function Tests: No results for input(s): TSH, T4TOTAL, FREET4, T3FREE, THYROIDAB in the last 72 hours. Anemia Panel: No results for input(s): VITAMINB12, FOLATE, FERRITIN, TIBC, IRON, RETICCTPCT in the last 72 hours. Urine analysis: No results found for: COLORURINE, APPEARANCEUR, LABSPEC, PHURINE, GLUCOSEU, HGBUR, BILIRUBINUR, KETONESUR, PROTEINUR, UROBILINOGEN, NITRITE, LEUKOCYTESUR  Radiological Exams on Admission: Dg Chest 2 View  Result Date: 08/26/2017 CLINICAL DATA:  Mid chest pain radiating into the left arm began 2 hours prior to presentation. There was associated shortness of breath and nausea and diaphoresis. Nonsmoker. EXAM: CHEST - 2 VIEW COMPARISON:  Chest x-ray of May 10, 2012 FINDINGS: The lungs are well-expanded. The interstitial markings are coarse and slightly more conspicuous today. The heart and pulmonary vascularity are normal. The mediastinum is normal in width. The trachea is midline. The bony thorax exhibits no acute abnormality. IMPRESSION: Mild bilateral interstitial prominence likely reflects bronchitic changes. No alveolar pneumonia nor pulmonary edema. Electronically Signed   By: David  Martinique M.D.   On: 08/26/2017 14:37   Nm Pulmonary Perf And Vent  Result Date: 08/26/2017 CLINICAL DATA:  Chest pain with suspected pulmonary embolism, low to intermediate probability, negative D-dimer. EXAM: NUCLEAR MEDICINE VENTILATION - PERFUSION LUNG SCAN TECHNIQUE: Ventilation images were obtained in multiple projections using inhaled aerosol [id number]. Perfusion images were obtained in multiple projections after intravenous injection of Tc-958mAA. RADIOPHARMACEUTICALS:  33 mCi of Tc-9918mPA aerosol inhalation and 4.4 mCi Tc99m53m IV  COMPARISON:  Chest x-ray from the same day. FINDINGS: No wedge shaped peripheral perfusion defects to suggest acute pulmonary embolism. Central airway deposition. IMPRESSION: Negative for pulmonary embolism. Electronically Signed   By: JonaMonte Fantasia.   On: 08/26/2017 18:45    EKG: Independently reviewed.  Normal sinus rhythm with EKG artifact  Assessment/Plan Principal Problem:   Chest pain Active Problems:   Essential hypertension   History of atrial fibrillation   Chronic back pain greater than 3 months duration   -  Admit to telemetry for observation, cycle cardiac enzymes, aspirin, check fasting lipid panel, consult cardiology. consider nuclear medicine pharmacologic stress test .\Consider GI source of discomfort as patient is on an NSAID.  Consider adding H2 versus PPI upon discharge -Continue home medications for chronic back pain and hypertension. -Reported History of A. Fib, in normal sinus rhythm at this time.  Continue aspirin and diltiazem  DVT prophylaxis: Lovenox  code Status: Full Family Communication: Discussed plan with patient and wife at bedside disposition Plan: Expect discharge within 24 hours after stress test Admission status: Observation telemetry  Zadkiel Dragan Johnson-Pitts MD [facility] Pager [phone]  If 7PM-7AM, please contact night-coverage [email or web address] Password [facility]  08/26/2017, 8:24 PM

## 2017-08-26 NOTE — Progress Notes (Signed)
Pt placed on [facility] CPAP. CPAP plugged into red outlet. No issues

## 2017-08-26 NOTE — ED Triage Notes (Addendum)
Cp in center of chest that radiates to lt arm x 1 hour.  Pt is clammy and clinching his chest.

## 2017-08-26 NOTE — ED Notes (Signed)
Cannot give admission medications until verified by pharmacy

## 2017-08-26 NOTE — ED Notes (Signed)
Pt to xray

## 2017-08-27 ENCOUNTER — Other Ambulatory Visit: Payer: Self-pay | Admitting: Student

## 2017-08-27 ENCOUNTER — Encounter (HOSPITAL_COMMUNITY): Payer: Self-pay | Admitting: Student

## 2017-08-27 DIAGNOSIS — R079 Chest pain, unspecified: Secondary | ICD-10-CM | POA: Diagnosis not present

## 2017-08-27 DIAGNOSIS — I10 Essential (primary) hypertension: Secondary | ICD-10-CM | POA: Diagnosis not present

## 2017-08-27 DIAGNOSIS — R0789 Other chest pain: Secondary | ICD-10-CM | POA: Diagnosis not present

## 2017-08-27 DIAGNOSIS — Z8679 Personal history of other diseases of the circulatory system: Secondary | ICD-10-CM | POA: Diagnosis not present

## 2017-08-27 LAB — HIV ANTIBODY (ROUTINE TESTING W REFLEX): HIV Screen 4th Generation wRfx: NONREACTIVE

## 2017-08-27 LAB — LIPID PANEL
CHOL/HDL RATIO: 5.9 ratio
Cholesterol: 196 mg/dL (ref 0–200)
HDL: 33 mg/dL — AB (ref >40)
LDL Cholesterol: 134 mg/dL — ABNORMAL HIGH (ref 0–99)
Triglycerides: 143 mg/dL (ref <150)
VLDL: 29 mg/dL (ref 0–40)

## 2017-08-27 NOTE — Discharge Summary (Signed)
Physician Discharge Summary  [name] LEX:[id number] DOB: February 04, 1961 DOA: 08/26/2017  PCP: Redmond School, MD  Admit date: 08/26/2017 Discharge date: 08/27/2017  Time spent: 45 minutes  Recommendations for Outpatient Follow-up:  -Will be discharged home today. -Advised to follow up with Dr. Domenic Polite on 7/25.   Discharge Diagnoses:  Principal Problem:   Chest pain Active Problems:   Essential hypertension   History of atrial fibrillation   Chronic back pain greater than 3 months duration   Discharge Condition: Stable and improved  Filed Weights   08/26/17 1352  Weight: 131.5 kg (290 lb)    History of present illness:  As per Dr. Baron Hamper on 7/23: [name] is a 57 y.o. male with medical history significant of hypertension presents with chest pain.  Earlier today patient had abdominal pain in the upper right region.  It radiated over to his sternum and then down his left arm.  He started having nausea and diaphoresis which prompted him to come into the ED.  he was given nitro which resolved his discomfort.  He has no known heart disease.  he had back surgery in April and has been recuperating from that.  Diagnosed with a superficial thrombosis in May.  He has been increasing his activity since his surgery.  Father had an MI at age 69.  Denies any cigarette or illegal drug use and only drinks alcohol rarely.  Had a nonremarkable chest x-ray and negative troponin upon admission to the ED.  VQ scan was negative for PE.       Hospital Course:   Atypical chest pain -Has some risk factors including gender, hypertension, family history of coronary artery disease, obesity. -He has ruled out for ACS with negative troponin levels, EKG is nonspecific. -VQ scan was low probability for PE. -Has been seen by cardiology who was planning for discharge home today and will arrange outpatient Lexiscan Myoview for follow-up ischemic testing.  Benign essential  hypertension -BP has been well controlled. -Continue Cardizem CD 120 mg daily and quinapril 20 mg daily.  Obstructive sleep apnea -Continue compliance with CPAP has been encouraged.   Procedures:  None   Consultations:  Cardiology  Discharge Instructions  Discharge Instructions    Diet - low sodium heart healthy   Complete by:  As directed    Increase activity slowly   Complete by:  As directed      Allergies as of 08/27/2017      Reactions   Iodinated Diagnostic Agents Palpitations, Hypertension   Iohexol Palpitations, Hypertension   Increased blood pressure       Medication List    STOP taking these medications   meloxicam 7.5 MG tablet Commonly known as:  MOBIC   Na Sulfate-K Sulfate-Mg Sulf 17.5-3.13-1.6 GM/177ML Soln Commonly known as:  SUPREP BOWEL PREP KIT   oxyCODONE 5 MG immediate release tablet Commonly known as:  Oxy IR/ROXICODONE   polyethylene glycol-electrolytes 420 g solution Commonly known as:  TRILYTE   tamsulosin 0.4 MG Caps capsule Commonly known as:  FLOMAX     TAKE these medications   aspirin EC 81 MG tablet Take 1 tablet (81 mg total) by mouth daily. Resume 4 days post-op What changed:    when to take this  additional instructions   diltiazem 120 MG 24 hr capsule Commonly known as:  CARDIZEM CD Take 1 capsule (120 mg total) by mouth daily. What changed:  when to take this   docusate sodium 100 MG capsule  Commonly known as:  COLACE Take 1 capsule (100 mg total) by mouth 2 (two) times daily. What changed:    when to take this  reasons to take this   escitalopram 20 MG tablet Commonly known as:  LEXAPRO Take 20 mg by mouth every morning.   gabapentin 300 MG capsule Commonly known as:  NEURONTIN Take 1 capsule (300 mg total) by mouth 3 (three) times daily. What changed:  how much to take   methocarbamol 500 MG tablet Commonly known as:  ROBAXIN Take 1 tablet (500 mg total) by mouth every 6 (six) hours as needed for  muscle spasms. What changed:  when to take this   polyethylene glycol packet Commonly known as:  MIRALAX / GLYCOLAX Take 17 g by mouth daily as needed for mild constipation.   quinapril 20 MG tablet Commonly known as:  ACCUPRIL Take 20 mg by mouth every morning.      Allergies  Allergen Reactions  . Iodinated Diagnostic Agents Palpitations and Hypertension  . Iohexol Palpitations and Hypertension    Increased blood pressure    Follow-up Information    Erma Heritage, PA-C Follow up on 09/02/2017.   Specialties:  Physician Assistant, Cardiology Why:  Your Stress Test will be on 09/02/2017 at 8:30 AM. Please arrive to the Radiology Department at [facility]. Nothing to eat or drink after midnight leading up to stress test. Cardiology Hospital Follow-up on 09/19/2017 at 3:00PM.  Contact information: [address] [place] [address] [phone]            The results of significant diagnostics from this hospitalization (including imaging, microbiology, ancillary and laboratory) are listed below for reference.    Significant Diagnostic Studies: Dg Chest 2 View  Result Date: 08/26/2017 CLINICAL DATA:  Mid chest pain radiating into the left arm began 2 hours prior to presentation. There was associated shortness of breath and nausea and diaphoresis. Nonsmoker. EXAM: CHEST - 2 VIEW COMPARISON:  Chest x-ray of May 10, 2012 FINDINGS: The lungs are well-expanded. The interstitial markings are coarse and slightly more conspicuous today. The heart and pulmonary vascularity are normal. The mediastinum is normal in width. The trachea is midline. The bony thorax exhibits no acute abnormality. IMPRESSION: Mild bilateral interstitial prominence likely reflects bronchitic changes. No alveolar pneumonia nor pulmonary edema. Electronically Signed   By: David  Martinique M.D.   On: 08/26/2017 14:37   Nm Pulmonary Perf And Vent  Result Date: 08/26/2017 CLINICAL DATA:  Chest pain with suspected  pulmonary embolism, low to intermediate probability, negative D-dimer. EXAM: NUCLEAR MEDICINE VENTILATION - PERFUSION LUNG SCAN TECHNIQUE: Ventilation images were obtained in multiple projections using inhaled aerosol Tc-29mDTPA. Perfusion images were obtained in multiple projections after intravenous injection of Tc-977mAA. RADIOPHARMACEUTICALS:  33 mCi of Tc-9980mPA aerosol inhalation and 4.4 mCi Tc99m32m IV COMPARISON:  Chest x-ray from the same day. FINDINGS: No wedge shaped peripheral perfusion defects to suggest acute pulmonary embolism. Central airway deposition. IMPRESSION: Negative for pulmonary embolism. Electronically Signed   By: JonaMonte Fantasia.   On: 08/26/2017 18:45    Microbiology: No results found for this or any previous visit (from the past 240 hour(s)).   Labs: Basic Metabolic Panel: Recent Labs  Lab 08/26/17 1405  NA 136  K 3.8  CL 108  CO2 19*  GLUCOSE 138*  BUN 15  CREATININE 1.12  CALCIUM 8.7*   Liver Function Tests: No results for input(s): AST, ALT, ALKPHOS, BILITOT, PROT, ALBUMIN in the last  168 hours. No results for input(s): LIPASE, AMYLASE in the last 168 hours. No results for input(s): AMMONIA in the last 168 hours. CBC: Recent Labs  Lab 08/26/17 1405  WBC 7.0  HGB 15.3  HCT 44.4  MCV 86.9  PLT 135*   Cardiac Enzymes: Recent Labs  Lab 08/26/17 1405 08/26/17 1703 08/26/17 1924 08/26/17 2243  TROPONINI <0.03 <0.03 <0.03 <0.03   BNP: BNP (last 3 results) No results for input(s): BNP in the last 8760 hours.  ProBNP (last 3 results) No results for input(s): PROBNP in the last 8760 hours.  CBG: No results for input(s): GLUCAP in the last 168 hours.     Signed:  Lelon Frohlich  [facility] Pager: [phone] 08/27/2017, 4:53 PM

## 2017-08-27 NOTE — Progress Notes (Signed)
Pt's IV catheter removed and intact. Pt's IV site clean dry and intact. Discharge instructions including medications and follow up appointments were reviewed and discussed with patient. All questions were answered and no further questions at this time. Pt in stable condition and in no acute distress at time of discharge. Pt escorted by RN. 

## 2017-08-27 NOTE — Consult Note (Addendum)
Cardiology Consult    Patient ID: JARRETT ALBOR; [id number]; Aug 11, 1960   Admit date: 08/26/2017 Date of Consult: 08/27/2017  Primary Care Provider: Redmond School, MD Primary Cardiologist: Previously followed by Dr. Johnsie Cancel in 2014  Patient Profile    ED MANDICH is a 57 y.o. male with past medical history of HTN, OSA and recent lumbar laminectomy and microdiscectomy in 05/2017 who is being seen today for the evaluation of chest pain at the request of Dr. Baron Hamper.   History of Present Illness    Mr. Wurzer was last examined by Dr. Johnsie Cancel in 06/2012 following a recent emergency department visit for evaluation of chest discomfort. An ETT had been obtained but was not entirely normal by review of notes, therefore an anatomical study with a Cardiac CT was recommended but I cannot see where this was obtained. It does not appear he has followed-up with Cardiology since.  He presented to [facility] ED on 08/26/2017 for evaluation of chest pain which started along his epigastric region but radiated to his left arm. Reported that symptoms initially began while he was sitting in his recliner. His pain lasted for over 2 hours before spontaneously resolving. Was not worse with exertion or positional changes. He did take SL NTG with some improvement in his symptoms. He reports being in his usual state of health over the past several weeks and denies any recent anginal symptoms when performing yard work or walking around the store. He has been less active since his recent surgery in 05/2017 and has been unable to lift more than 10 pounds.  He has not been exercising regularly secondary to this. He denies any recent dyspnea on exertion, orthopnea, PND, or lower extremity edema. He did travel to [place] last week for a funeral.  He has known HTN but denies any history of CAD, HLD, or Type II DM. He does have a family history of CAD with his father having an MI at age 60.  His mother had "heart valve  issues" by his report. He denies any personal history of tobacco use or recreational drug use. Uses alcohol on a social basis.   Initial labs show WBC 7.0, Hgb 15.3, platelets 135, Na+ 136, K+ 3.8, and creatinine 1.12. Initial and cyclic troponin values have been negative. CXR shows mild bilateral interstitial prominence likely reflecting bronchitic changes with no pneumonia or edema noted. VQ scan was negative for PE. EKG shows normal sinus rhythm, heart rate 65, with no acute ST changes when compared to prior tracing.  He denies any recurrent chest pain since admission. Reports feeling back to his normal state of health this morning.     Past Medical History:  Diagnosis Date  . Arthritis    "right knee"  . Depression   . Dysrhythmia   . GERD (gastroesophageal reflux disease)   . High cholesterol   . History of kidney stones    "33 years ago"  . Hypertension   . Irregular heart rhythm    a. PVC's by prior event monitor.   . Pneumonia   . Sleep apnea    wears CPAP    Past Surgical History:  Procedure Laterality Date  . BACK SURGERY    . HERNIA REPAIR    . KNEE SURGERY Right    x2  . LUMBAR LAMINECTOMY/DECOMPRESSION MICRODISCECTOMY N/A 05/08/2017   Procedure: Microlumbar decompression Lumbar two-three;  Surgeon: Susa Day, MD;  Location: [place];  Service: Orthopedics;  Laterality: N/A;  . LUMBAR  LAMINECTOMY/DECOMPRESSION MICRODISCECTOMY N/A 05/16/2017   Procedure: REVISION LUMBAR DECOMPRESSION LUMBAR TWO TO LUMBAR THREE;  Surgeon: Susa Day, MD;  Location: [facility];  Service: Orthopedics;  Laterality: N/A;     Home Medications:  Prior to Admission medications   Medication Sig Start Date End Date Taking? Authorizing Provider  aspirin EC 81 MG tablet Take 1 tablet (81 mg total) by mouth daily. Resume 4 days post-op Patient taking differently: Take 81 mg by mouth every morning.  05/16/17  Yes Lacie Draft M, PA-C  diltiazem (CARDIZEM CD) 120 MG 24 hr capsule Take 1  capsule (120 mg total) by mouth daily. Patient taking differently: Take 120 mg by mouth every morning.  01/14/14  Yes Josue Hector, MD  docusate sodium (COLACE) 100 MG capsule Take 1 capsule (100 mg total) by mouth 2 (two) times daily. Patient taking differently: Take 100 mg by mouth 2 (two) times daily as needed for mild constipation or moderate constipation.  05/09/17  Yes Lacie Draft M, PA-C  escitalopram (LEXAPRO) 20 MG tablet Take 20 mg by mouth every morning.    Yes [provider]  gabapentin (NEURONTIN) 300 MG capsule Take 1 capsule (300 mg total) by mouth 3 (three) times daily. Patient taking differently: Take 900 mg by mouth 3 (three) times daily.  05/09/17  Yes Lacie Draft M, PA-C  meloxicam (MOBIC) 7.5 MG tablet Take 7.5 mg by mouth every morning.   Yes [provider]  methocarbamol (ROBAXIN) 500 MG tablet Take 1 tablet (500 mg total) by mouth every 6 (six) hours as needed for muscle spasms. Patient taking differently: Take 500 mg by mouth every morning.  05/09/17  Yes Lacie Draft M, PA-C  polyethylene glycol (MIRALAX / GLYCOLAX) packet Take 17 g by mouth daily as needed for mild constipation. 05/09/17  Yes Lacie Draft M, PA-C  quinapril (ACCUPRIL) 20 MG tablet Take 20 mg by mouth every morning.    Yes [provider]  Na Sulfate-K Sulfate-Mg Sulf (SUPREP BOWEL PREP KIT) 17.5-3.13-1.6 GM/177ML SOLN Take 1 kit by mouth as directed. Patient not taking: Reported on 05/16/2017 02/05/17   Annitta Needs, NP  oxyCODONE (OXY IR/ROXICODONE) 5 MG immediate release tablet Take 1 tablet (5 mg total) by mouth every 4 (four) hours as needed for moderate pain ((score 4 to 6)). Patient not taking: Reported on 08/26/2017 05/18/17   Ardeen Jourdain, PA-C  oxyCODONE (ROXICODONE) 5 MG immediate release tablet Take 1-2 tablets (5-10 mg total) by mouth every 4 (four) hours as needed. Patient not taking: Reported on 08/26/2017 05/09/17 05/09/18  Cecilie Kicks, PA-C    polyethylene glycol-electrolytes (TRILYTE) 420 g solution Take 4,000 mLs by mouth as directed. Patient not taking: Reported on 05/16/2017 02/10/17   Annitta Needs, NP  tamsulosin (FLOMAX) 0.4 MG CAPS capsule Take 1 capsule (0.4 mg total) by mouth daily. Rx sent to pharmacy from office Friday Patient not taking: Reported on 08/26/2017 05/17/17   Cecilie Kicks, PA-C    Inpatient Medications: Scheduled Meds: . aspirin EC  81 mg Oral q morning - 10a  . diltiazem  120 mg Oral q morning - 10a  . enoxaparin (LOVENOX) injection  40 mg Subcutaneous Q24H  . escitalopram  20 mg Oral q morning - 10a  . gabapentin  900 mg Oral TID  . methocarbamol  500 mg Oral q morning - 10a  . quinapril  20 mg Oral q morning - 10a   Continuous Infusions:  PRN Meds: acetaminophen, docusate sodium,  gi cocktail, morphine injection, nitroGLYCERIN, ondansetron (ZOFRAN) IV, polyethylene glycol  Allergies:    Allergies  Allergen Reactions  . Iodinated Diagnostic Agents Palpitations and Hypertension  . Iohexol Palpitations and Hypertension    Increased blood pressure     Social History:   Social History   Socioeconomic History  . Marital status: Married    Spouse name: Not on file  . Number of children: Not on file  . Years of education: Not on file  . Highest education level: Not on file  Occupational History  . Not on file  Social Needs  . Financial resource strain: Not on file  . Food insecurity:    Worry: Not on file    Inability: Not on file  . Transportation needs:    Medical: Not on file    Non-medical: Not on file  Tobacco Use  . Smoking status: Never Smoker  . Smokeless tobacco: Never Used  Substance and Sexual Activity  . Alcohol use: Yes    Alcohol/week: 0.0 oz    Comment: 3-4 beers a month "if that"  . Drug use: No  . Sexual activity: Not on file  Lifestyle  . Physical activity:    Days per week: Not on file    Minutes per session: Not on file  . Stress: Not on file   Relationships  . Social connections:    Talks on phone: Not on file    Gets together: Not on file    Attends religious service: Not on file    Active member of club or organization: Not on file    Attends meetings of clubs or organizations: Not on file    Relationship status: Not on file  . Intimate partner violence:    Fear of current or ex partner: Not on file    Emotionally abused: Not on file    Physically abused: Not on file    Forced sexual activity: Not on file  Other Topics Concern  . Not on file  Social History Narrative  . Not on file     Family History:    Family History  Problem Relation Age of Onset  . Coronary artery disease Father   . Diabetes Father   . Heart disease Mother       Review of Systems    General:  No chills, fever, night sweats or weight changes.  Cardiovascular:  No dyspnea on exertion, edema, orthopnea, palpitations, paroxysmal nocturnal dyspnea. Positive for chest pain.  Dermatological: No rash, lesions/masses Respiratory: No cough, dyspnea Urologic: No hematuria, dysuria Abdominal:   No nausea, vomiting, diarrhea, bright red blood per rectum, melena, or hematemesis Neurologic:  No visual changes, wkns, changes in mental status. All other systems reviewed and are otherwise negative except as noted above.  Physical Exam/Data    Vitals:   08/26/17 1700 08/26/17 1902 08/26/17 2122 08/27/17 0646  BP: (!) 114/49 119/79 111/71 (!) 128/52  Pulse: (!) 51 (!) 51 (!) 54 (!) 51  Resp: _0 Temp:  98.2 F (36.8 C) 97.6 F (36.4 C)   TempSrc:  Oral Oral   SpO2: 96% 97% 99% 99%  Weight:      Height:       No intake or output data in the 24 hours ending 08/27/17 0827 Filed Weights   08/26/17 1352  Weight: 290 lb (131.5 kg)   Body mass index is 36.25 kg/m.   General: Pleasant Caucasian male appearing in NAD. Psych: Normal affect. Neuro:  Alert and oriented X 3. Moves all extremities spontaneously. HEENT: Normal  Neck: Supple  without bruits or JVD. Lungs:  Resp regular and unlabored, CTA without wheezing or rales. Heart: RRR no s3, s4, or murmurs. Abdomen: Soft, non-tender, non-distended, BS + x 4.  Extremities: No clubbing, cyanosis or edema. DP/PT/Radials 2+ and equal bilaterally.   EKG:  The EKG was personally reviewed and demonstrates: NSR, HR 65, with no acute ST changes when compared to prior tracing.  Telemetry:  Telemetry was personally reviewed and demonstrates:  Sinus bradycardia, HR in mid-40's to 50's. No significant pauses appreciated.    Labs/Studies     Relevant CV Studies:  Echocardiogram: 05/2012 Study Conclusions  Left ventricle: The cavity size was normal. There was mild concentric hypertrophy. Systolic function was normal. The estimated ejection fraction was in the range of 55% to 60%. Wall motion was normal; there were no regional wall motion abnormalities.     Laboratory Data:  Chemistry Recent Labs  Lab 08/26/17 1405  NA 136  K 3.8  CL 108  CO2 19*  GLUCOSE 138*  BUN 15  CREATININE 1.12  CALCIUM 8.7*  GFRNONAA >60  GFRAA >60  ANIONGAP 9    No results for input(s): PROT, ALBUMIN, AST, ALT, ALKPHOS, BILITOT in the last 168 hours. Hematology Recent Labs  Lab 08/26/17 1405  WBC 7.0  RBC 5.11  HGB 15.3  HCT 44.4  MCV 86.9  MCH 29.9  MCHC 34.5  RDW 13.1  PLT 135*   Cardiac Enzymes Recent Labs  Lab 08/26/17 1405 08/26/17 1703 08/26/17 1924 08/26/17 2243  TROPONINI <0.03 <0.03 <0.03 <0.03   No results for input(s): TROPIPOC in the last 168 hours.  BNPNo results for input(s): BNP, PROBNP in the last 168 hours.  DDimer No results for input(s): DDIMER in the last 168 hours.  Radiology/Studies:  Dg Chest 2 View  Result Date: 08/26/2017 CLINICAL DATA:  Mid chest pain radiating into the left arm began 2 hours prior to presentation. There was associated shortness of breath and nausea and diaphoresis. Nonsmoker. EXAM: CHEST - 2 VIEW COMPARISON:  Chest  x-ray of May 10, 2012 FINDINGS: The lungs are well-expanded. The interstitial markings are coarse and slightly more conspicuous today. The heart and pulmonary vascularity are normal. The mediastinum is normal in width. The trachea is midline. The bony thorax exhibits no acute abnormality. IMPRESSION: Mild bilateral interstitial prominence likely reflects bronchitic changes. No alveolar pneumonia nor pulmonary edema. Electronically Signed   By: David  Martinique M.D.   On: 08/26/2017 14:37   Nm Pulmonary Perf And Vent  Result Date: 08/26/2017 CLINICAL DATA:  Chest pain with suspected pulmonary embolism, low to intermediate probability, negative D-dimer. EXAM: NUCLEAR MEDICINE VENTILATION - PERFUSION LUNG SCAN TECHNIQUE: Ventilation images were obtained in multiple projections using inhaled aerosol Tc-33mDTPA. Perfusion images were obtained in multiple projections after intravenous injection of Tc-992mAA. RADIOPHARMACEUTICALS:  33 mCi of Tc-9972mPA aerosol inhalation and 4.4 mCi Tc99m74m IV COMPARISON:  Chest x-ray from the same day. FINDINGS: No wedge shaped peripheral perfusion defects to suggest acute pulmonary embolism. Central airway deposition. IMPRESSION: Negative for pulmonary embolism. Electronically Signed   By: JonaMonte Fantasia.   On: 08/26/2017 18:45     Assessment & Plan    1. Atypical Chest Pain - the patient reports having new-onset chest/epigastric discomfort starting yesterday afternoon which occurred while he was sitting in the recliner and radiated down his left arm. Symptoms lasted for over 2 hours and were not  associated with exertion or positional changes.  - Initial and cyclic troponin values have been negative. VQ scan was negative for PE. EKG shows NSR, HR 65, with no acute ST changes when compared to prior tracings. Will add-on FLP.  - overall, his pain seems atypical for a cardiac etiology but he does have cardiac risk factors of HTN, HLD, and family history of CAD.  Cardiology was initially consulted to assist in arranging a NST but he had a VQ scan yesterday evening. I verified with Nuclear Medicine that these cannot be obtained within 48 hours of one another due to this influencing the quality of the images. With him having ruled-out for ACS and due to his atypical symptoms, I would suspect this can be performed as an outpatient within the next week. This was reviewed with the patient and he is in agreement. Would need to be a [employer] as he is unable to walk on a treadmill due to his recent back surgery.   2. HTN - BP has been well-controlled at 111/48 - 150/96 since admission.  - continue Cardizem CD 190m daily and Quinapril 279mdaily.   3. OSA - continued compliance with CPAP encouraged.   For questions or updates, please contact [facility] consult [email or web address] for contact info under Cardiology/STEMI.  Signed, BrErma HeritagePA-C 08/27/2017, 8:27 AM Pager: [phone]  Attending note:  Patient seen and examined.  I reviewed his records and discussed the case with Ms. StAhmed PrimaA-C.  Mr. PaArveloresents after a recent episode of chest pain that occurred at rest, started in the epigastric region while he was sitting in a recliner.  Symptoms lasted for a little over 2 hours, he did take nitroglycerin ultimately.  [place], was worked up by primary team for pulmonary embolus, low probability VQ scan.  Cardiac risk factors include age and gender, hypertension, and family history of CAD in his father.  He underwent stress testing back in 2014 when evaluated by Dr. NiJohnsie Cancel On examination this morning he appears comfortable, reports no chest pain at present.  Heart rate is in the 60s in sinus rhythm by telemetry which I personally reviewed.  Systolic blood pressure 11242-353ange.  Lungs are clear without labored breathing.  Cardiac exam reveals RRR without gallop.  Lab work reveals  negative troponin I levels x3, potassium 3.8, BUN 15, creatinine 1.12, hemoglobin 15.3, platelets 135.  Chest x-ray reports mild bronchitic changes.  Personally reviewed his recent ECGs showing sinus rhythm with nonspecific T wave changes, variable R wave progression possibly reflective of lead placement.  Atypical chest pain in a 89ear old obese male with hypertension and family history of CAD in his father.  He is ruled out for ACS with negative troponin I levels, ECG overall nonspecific.  VQ scan was low probability for pulmonary embolus in the setting of recent travel.  Anticipate discharge home today and we will arrange an outpatient [place] follow-up ischemic evaluation.  SaSatira SarkM.D., F.A.C.C.

## 2017-08-27 NOTE — Discharge Instructions (Addendum)
° °  You have a Stress Test scheduled at [facility]. Your doctor has ordered this test to check the blood flow in your heart arteries.  Please arrive 15 minutes early for paperwork. The whole test will take several hours. You may want to bring reading material to remain occupied while undergoing different parts of the test.   TEST WILL BE PERFORMED AT [facility]. ARRIVE TO THE RADIOLOGY DEPARTMENT AT 8:30 AM on 09/02/2017.  Instructions:  No food/drink after midnight the night before.  It is OK to take your morning meds with a sip of water EXCEPT for those types of medicines listed below or otherwise instructed.  No caffeine/decaf products 24 hours before, including medicines such as Excedrin or Goody Powders. Call if there are any questions.   Wear comfortable clothes and shoes.   Special Medication Instructions:  Beta blockers such as metoprolol (Lopressor/Toprol XL), atenolol (Tenormin), carvedilol (Coreg), nebivolol (Bystolic), bisoprolol (Zebeta), propranolol (Inderal) should not be taken for 24 hours before the test.  Calcium channel blockers such as diltiazem (Cardizem) or verapmil (Calan) should not be taken for 24 hours before the test.  Remove nitroglycerin patches and do not take nitrate preparations such as Imdur/isosorbide the day of your test.  No Persantine/Theophylline or Aggrenox medicines should be used within 24 hours of the test.   If you are diabetic, please ask which medications to hold the day of the test.  What To Expect: When you arrive in the lab, the technician will inject a small amount of radioactive tracer into your arm through an IV while you are resting quietly. This helps us to form pictures of your heart. You will likely only feel a sting from the IV. After a waiting period, resting pictures will be obtained under a big camera. These are the "before" pictures.  Next, you will be prepped for the stress portion of the test. This  may include either walking on a treadmill or receiving a medicine that helps to dilate blood vessels in your heart to simulate the effect of exercise on your heart. If you are walking on a treadmill, you will walk at different paces to try to get your heart rate to a goal number that is based on your age. If your doctor has chosen the pharmacologic test, then you will receive a medicine through your IV that may cause temporary nausea, flushing, shortness of breath and sometimes chest discomfort or vomiting. This is typically short-lived and usually resolves quickly. If you experience symptoms, that does not automatically mean the test is abnormal. Some patients do not experience any symptoms at all. Your blood pressure and heart rate will be monitored, and we will be watching your EKG on a computer screen for any changes. During this portion of the test, the radiologist will inject another small amount of radioactive tracer into your IV. After a waiting period, you will undergo a second set of pictures. These are the "after" pictures.  The doctor reading the test will compare the before-and-after images to look for evidence of heart blockages or heart weakness. The test usually takes 1 day to complete, but in certain instances (for example, if a patient is over a certain weight limit), the test may be done over the span of 2 days.

## 2017-09-02 ENCOUNTER — Encounter (HOSPITAL_COMMUNITY)
Admission: RE | Admit: 2017-09-02 | Discharge: 2017-09-02 | Disposition: A | Discharge status: Home / Self Care | Payer: 59 | Source: Ambulatory Visit | Attending: Student | Admitting: Student

## 2017-09-02 ENCOUNTER — Other Ambulatory Visit (HOSPITAL_COMMUNITY)
Admission: RE | Admit: 2017-09-02 | Discharge: 2017-09-02 | Disposition: A | Discharge status: Home / Self Care | Payer: 59 | Source: Ambulatory Visit | Attending: Student | Admitting: Student

## 2017-09-02 ENCOUNTER — Telehealth: Payer: Self-pay | Admitting: *Deleted

## 2017-09-02 ENCOUNTER — Other Ambulatory Visit: Payer: Self-pay | Admitting: *Deleted

## 2017-09-02 ENCOUNTER — Encounter (HOSPITAL_BASED_OUTPATIENT_CLINIC_OR_DEPARTMENT_OTHER)
Admit: 2017-09-02 | Discharge: 2017-09-02 | Disposition: A | Discharge status: Home / Self Care | Payer: 59 | Attending: Student | Admitting: Student

## 2017-09-02 ENCOUNTER — Encounter (HOSPITAL_COMMUNITY): Payer: Self-pay

## 2017-09-02 DIAGNOSIS — M79604 Pain in right leg: Secondary | ICD-10-CM

## 2017-09-02 DIAGNOSIS — Z86718 Personal history of other venous thrombosis and embolism: Secondary | ICD-10-CM

## 2017-09-02 DIAGNOSIS — R079 Chest pain, unspecified: Secondary | ICD-10-CM | POA: Insufficient documentation

## 2017-09-02 LAB — NM MYOCAR MULTI W/SPECT W/WALL MOTION / EF
CHL CUP NUCLEAR SRS: 3
CHL CUP NUCLEAR SSS: 8
LV dias vol: 156 mL (ref 62–150)
LV sys vol: 57 mL
Peak HR: 75 {beats}/min
RATE: 0.33
Rest HR: 48 {beats}/min
SDS: 5
TID: 1.07

## 2017-09-02 LAB — D-DIMER, QUANTITATIVE: D-Dimer, Quant: 0.44 ug/mL-FEU (ref 0.00–0.50)

## 2017-09-02 MED ORDER — REGADENOSON 0.4 MG/5ML IV SOLN
INTRAVENOUS | Status: AC
  Administered 2017-09-02: 0.4 mg via INTRAVENOUS
  Filled 2017-09-02: qty 5

## 2017-09-02 MED ORDER — TECHNETIUM TC 99M TETROFOSMIN IV KIT
30.0000 | PACK | Freq: Once | INTRAVENOUS | Status: AC | PRN
  Administered 2017-09-02: 30 via INTRAVENOUS

## 2017-09-02 MED ORDER — SODIUM CHLORIDE 0.9% FLUSH
INTRAVENOUS | Status: AC
  Administered 2017-09-02: 10 mL via INTRAVENOUS
  Filled 2017-09-02: qty 10

## 2017-09-02 MED ORDER — TECHNETIUM TC 99M TETROFOSMIN IV KIT
10.0000 | PACK | Freq: Once | INTRAVENOUS | Status: AC | PRN
  Administered 2017-09-02: 10 via INTRAVENOUS

## 2017-09-02 NOTE — Telephone Encounter (Signed)
Pt in office before stress test. Pt states on Saturday evening he found tender knot to back of right leg. Knot is tender touch red in color and measures 4 cm X 3 cm. Please advise

## 2017-09-02 NOTE — Telephone Encounter (Addendum)
   Patient was evaluated at the time of his stress test. He has an area of erythema along his right lower extremity which is tender to palpation and a palpable knot is noted.  He reports a history of a DVT but is no longer on anticoagulation. Also has a history of superficial thrombosis as outlined by prior US imaging in 06/2017. No evidence of a DVT at that time.   He reports his pain is more significant than previous. Was also just hospitalized last week. Given his history of a DVT and current symptoms, will check a D-dimer. If elevated, would need a right lower extremity doppler. If negative, should follow-up with his PCP for further testing/treatment.   Signed, Ellsworth LennoxBrittany M Nghia Mcentee, PA-C 09/02/2017, 10:33 AM Pager: [phone]

## 2017-09-03 ENCOUNTER — Other Ambulatory Visit: Payer: Self-pay | Admitting: Student

## 2017-09-03 MED ORDER — NITROGLYCERIN 0.4 MG SL SUBL: 0.4000 mg | SUBLINGUAL_TABLET | SUBLINGUAL | 2 refills | Status: DC | PRN

## 2017-09-19 ENCOUNTER — Ambulatory Visit: Payer: 59 | Admitting: Student

## 2017-09-19 ENCOUNTER — Other Ambulatory Visit (HOSPITAL_COMMUNITY)
Admission: RE | Admit: 2017-09-19 | Discharge: 2017-09-19 | Disposition: A | Discharge status: Home / Self Care | Payer: 59 | Source: Other Acute Inpatient Hospital | Attending: Otolaryngology | Admitting: Otolaryngology

## 2017-09-19 ENCOUNTER — Encounter: Payer: Self-pay | Admitting: Student

## 2017-09-19 VITALS — BP 132/62 | HR 56 | Ht 75.0 in | Wt 296.0 lb

## 2017-09-19 DIAGNOSIS — R079 Chest pain, unspecified: Secondary | ICD-10-CM | POA: Diagnosis not present

## 2017-09-19 DIAGNOSIS — I10 Essential (primary) hypertension: Secondary | ICD-10-CM | POA: Diagnosis not present

## 2017-09-19 DIAGNOSIS — G4733 Obstructive sleep apnea (adult) (pediatric): Secondary | ICD-10-CM

## 2017-09-19 DIAGNOSIS — R9439 Abnormal result of other cardiovascular function study: Secondary | ICD-10-CM

## 2017-09-19 LAB — BASIC METABOLIC PANEL
ANION GAP: 7 (ref 5–15)
BUN: 11 mg/dL (ref 6–20)
CHLORIDE: 106 mmol/L (ref 98–111)
CO2: 22 mmol/L (ref 22–32)
Calcium: 9.1 mg/dL (ref 8.9–10.3)
Creatinine, Ser: 1.03 mg/dL (ref 0.61–1.24)
GFR calc non Af Amer: >60 mL/min (ref >60)
Glucose, Bld: 95 mg/dL (ref 70–99)
POTASSIUM: 4 mmol/L (ref 3.5–5.1)
SODIUM: 135 mmol/L (ref 135–145)

## 2017-09-19 LAB — CBC
HEMATOCRIT: 46.6 % (ref 39.0–52.0)
HEMOGLOBIN: 16 g/dL (ref 13.0–17.0)
MCH: 29.4 pg (ref 26.0–34.0)
MCHC: 34.3 g/dL (ref 30.0–36.0)
MCV: 85.5 fL (ref 78.0–100.0)
PLATELETS: 184 10*3/uL (ref 150–400)
RBC: 5.45 MIL/uL (ref 4.22–5.81)
RDW: 13.1 % (ref 11.5–15.5)
WBC: 6.6 10*3/uL (ref 4.0–10.5)

## 2017-09-19 MED ORDER — PREDNISONE 50 MG PO TABS: ORAL_TABLET | ORAL | 0 refills | Status: DC

## 2017-09-19 NOTE — Progress Notes (Signed)
Cardiology Office Note    Date:  09/20/2017   ID:  [name], DOB 08-Aug-1960, MRN [id number]  PCP:  Elfredia Nevins, MD  Cardiologist: Nona Dell, MD    Chief Complaint  Patient presents with  . Hospitalization Follow-up    History of Present Illness:    [name] is a 57 y.o. male with past medical history of HTN, OSA, and recent lumbar laminectomy and microdiscectomy in 05/2017 who presents to the office today for hospital follow-up and to review results from his recent stress test.   He was admitted to [facility] on 08/26/2017 for evaluation of chest pain which started along his epigastric region but radiated to his left arm. Symptoms occurred at rest and lasted for over 2 hours before spontaneously resolving. Was not worse with exertion or positional changes. Initial and cyclic troponin values were negative and EKG showed no acute ST changes when compared to prior tracings. A VQ Scan had been performed at the time of admission to rule-out a PE (scan was negative), therefore a NST could not be obtained during admission. This was performed as an outpatient on 09/02/2017 and showed evidence of prior inferior/inferoseptal/septal/apical myocardial infarction with mild to moderate peri-infarct ischemia with significant adjacent gut radiotracer uptake that may affect the defect but there did appear to be some relative hypokinesis suggesting the defect truly does represent prior infarct with mild to moderate ischemia. Given his stress test findings, this was reviewed with Dr. Diona Browner and a cardiac catheterization was recommended for definitive evaluation.   In talking with the patient today, he does report occasional episodes of chest discomfort over the past 2 weeks which can occur at rest or with activity and typically lasts for 15 to 20 minutes before spontaneously resolving. He has been carrying sublingual nitroglycerin with him but has been afraid to use this due to hearing  about possible side effects. He reports associated dyspnea with the episodes but denies any radiating pain, nausea, vomiting, or diaphoresis. Denies any episodes over the past few days leading up to his visit.   No recent palpitations, orthopnea, PND, or lower extremity edema. No planned upcoming surgeries or procedures.    Past Medical History:  Diagnosis Date  . Arthritis    "right knee"  . Depression   . Dysrhythmia   . GERD (gastroesophageal reflux disease)   . High cholesterol   . History of kidney stones    "33 years ago"  . Hypertension   . Irregular heart rhythm    a. PVC's by prior event monitor.   . Pneumonia   . Sleep apnea    wears CPAP    Past Surgical History:  Procedure Laterality Date  . BACK SURGERY    . HERNIA REPAIR    . KNEE SURGERY Right    x2  . LUMBAR LAMINECTOMY/DECOMPRESSION MICRODISCECTOMY N/A 05/08/2017   Procedure: Microlumbar decompression Lumbar two-three;  Surgeon: Jene Every, MD;  Location: [facility] OR;  Service: Orthopedics;  Laterality: N/A;  . LUMBAR LAMINECTOMY/DECOMPRESSION MICRODISCECTOMY N/A 05/16/2017   Procedure: REVISION LUMBAR DECOMPRESSION LUMBAR TWO TO LUMBAR THREE;  Surgeon: Jene Every, MD;  Location: [facility] ORS;  Service: Orthopedics;  Laterality: N/A;    Current Medications: Outpatient Medications Prior to Visit  Medication Sig Dispense Refill  . aspirin EC 81 MG tablet Take 1 tablet (81 mg total) by mouth daily. Resume 4 days post-op (Patient taking differently: Take 81 mg by mouth every morning. )    . docusate sodium (  COLACE) 100 MG capsule Take 1 capsule (100 mg total) by mouth 2 (two) times daily. (Patient taking differently: Take 100 mg by mouth 2 (two) times daily as needed for mild constipation or moderate constipation. ) 40 capsule 0  . escitalopram (LEXAPRO) 20 MG tablet Take 20 mg by mouth every morning.     . gabapentin (NEURONTIN) 300 MG capsule Take 1 capsule (300 mg total) by mouth 3 (three) times daily. (Patient  taking differently: Take 900 mg by mouth 3 (three) times daily. ) 90 capsule 1  . methocarbamol (ROBAXIN) 500 MG tablet Take 1 tablet (500 mg total) by mouth every 6 (six) hours as needed for muscle spasms. (Patient taking differently: Take 500 mg by mouth every morning. ) 40 tablet 1  . nitroGLYCERIN (NITROSTAT) 0.4 MG SL tablet Place 1 tablet (0.4 mg total) under the tongue every 5 (five) minutes as needed for chest pain. 25 tablet 2  . polyethylene glycol (MIRALAX / GLYCOLAX) packet Take 17 g by mouth daily as needed for mild constipation. 14 each 0  . quinapril (ACCUPRIL) 20 MG tablet Take 20 mg by mouth every morning.     . diltiazem (CARDIZEM CD) 120 MG 24 hr capsule Take 1 capsule (120 mg total) by mouth daily. (Patient taking differently: Take 120 mg by mouth every morning. ) 30 capsule 0   No facility-administered medications prior to visit.      Allergies:   Iodinated diagnostic agents and Iohexol   Social History   Socioeconomic History  . Marital status: Married    Spouse name: Not on file  . Number of children: Not on file  . Years of education: Not on file  . Highest education level: Not on file  Occupational History  . Not on file  Social Needs  . Financial resource strain: Not on file  . Food insecurity:    Worry: Not on file    Inability: Not on file  . Transportation needs:    Medical: Not on file    Non-medical: Not on file  Tobacco Use  . Smoking status: Never Smoker  . Smokeless tobacco: Never Used  Substance and Sexual Activity  . Alcohol use: Yes    Alcohol/week: 0.0 standard drinks    Comment: 3-4 beers a month "if that"  . Drug use: No  . Sexual activity: Not on file  Lifestyle  . Physical activity:    Days per week: Not on file    Minutes per session: Not on file  . Stress: Not on file  Relationships  . Social connections:    Talks on phone: Not on file    Gets together: Not on file    Attends religious service: Not on file    Active member  of club or organization: Not on file    Attends meetings of clubs or organizations: Not on file    Relationship status: Not on file  Other Topics Concern  . Not on file  Social History Narrative  . Not on file     Family History:  The patient's family history includes Coronary artery disease in his father; Diabetes in his father; Heart disease in his mother.   Review of Systems:   Please see the history of present illness.     General:  No chills, fever, night sweats or weight changes.  Cardiovascular:  No dyspnea on exertion, edema, orthopnea, palpitations, paroxysmal nocturnal dyspnea. Positive for chest pain (no current symptoms).  Dermatological: No rash, lesions/masses  Respiratory: No cough, dyspnea Urologic: No hematuria, dysuria Abdominal:   No nausea, vomiting, diarrhea, bright red blood per rectum, melena, or hematemesis Neurologic:  No visual changes, wkns, changes in mental status. All other systems reviewed and are otherwise negative except as noted above.   Physical Exam:    VS:  BP 132/62 (BP Location: Left Arm)   Pulse (!) 56   Ht 6\' 3"  (1.905 m)   Wt 296 lb (134.3 kg)   SpO2 95%   BMI 37.00 kg/m    General: Well developed, well nourished Caucasian male appearing in no acute distress. Head: Normocephalic, atraumatic, sclera non-icteric, no xanthomas, nares are without discharge.  Neck: No carotid bruits. JVD not elevated.  Lungs: Respirations regular and unlabored, without wheezes or rales.  Heart: Regular rate and rhythm. No S3 or S4.  No murmur, no rubs, or gallops appreciated. Abdomen: Soft, non-tender, non-distended with normoactive bowel sounds. No hepatomegaly. No rebound/guarding. No obvious abdominal masses. Msk:  Strength and tone appear normal for age. No joint deformities or effusions. Extremities: No clubbing or cyanosis. No lower extremity edema.  Distal pedal pulses are 2+ bilaterally. Neuro: Alert and oriented X 3. Moves all extremities  spontaneously. No focal deficits noted. Psych:  Responds to questions appropriately with a normal affect. Skin: No rashes or lesions noted  Wt Readings from Last 3 Encounters:  09/19/17 296 lb (134.3 kg)  08/26/17 290 lb (131.5 kg)  05/16/17 294 lb (133.4 kg)     Studies/Labs Reviewed:   EKG:  EKG is not ordered today.   Recent Labs: 09/19/2017: BUN 11; Creatinine, Ser 1.03; Hemoglobin 16.0; Platelets 184; Potassium 4.0; Sodium 135   Lipid Panel    Component Value Date/Time   CHOL 196 08/27/2017 0914   TRIG 143 08/27/2017 0914   HDL 33 (L) 08/27/2017 0914   CHOLHDL 5.9 08/27/2017 0914   VLDL 29 08/27/2017 0914   LDLCALC 134 (H) 08/27/2017 0914    Additional studies/ records that were reviewed today include:   NST: 09/02/2017  There was no ST segment deviation noted during stress.  The left ventricular ejection fraction is normal (55-65%).  Findings consistent with prior inferior/inferoseptal/septal/apical myocardial infarction with mild to moderate peri-infarct ischemia. There is significant adjacent gut radiotracer uptake that may affect the defect. There does appear to be some relative hypokinesis suggesting the defect truly does represent prior infarct with mild to moderate ischemia.  Low to intermediate risk study.  Assessment:    1. Chest pain, unspecified type   2. Abnormal nuclear stress test   3. Essential hypertension   4. OSA (obstructive sleep apnea)      Plan:   In order of problems listed above:  1. Chest Pain/ Abnormal Stress Test - recently admitted for chest pain which was thought to be atypical for a cardiac etiology but outpatient stress testing showed evidence of prior inferior/inferoseptal/septal/apical myocardial infarction with mild to moderate peri-infarct ischemia as outlined above.  - he has continued to experience episodes of chest pain over the past few weeks which have both typical and atypical features as symptoms can occur at rest or  with activity and typically resolve within 15-20 minutes. No association with food consumption. Reviewed the use of SL NTG at today's visit.  - stress test results were reviewed with Dr. Diona Browner and plans are to proceed with a cardiac catheterization given his recent symptoms, stress test results, and risk factors. The patient understands that risks include but are not limited to stroke (  1 in 1000), death (1 in 1000), kidney failure [usually temporary] (1 in 500), bleeding (1 in 200), allergic reaction [possibly serious] (1 in 200). Will pre-treat for contrast allergy with Prednisone and Benadryl as he reports a history of contrast allergy (unable to provide specifics about his reaction). Will obtain pre-procedure labs today.  - continue ASA and ACE-I. Not on BB therapy given baseline bradycardia. Pending cath results, will need to consider initiation of statin therapy (LDL 134 when checked during recent admission).   2. HTN - BP is well-controlled at 132/62 during today's visit. - continue Quinapril 20mg  daily.   3. OSA - continued compliance with CPAP encouraged.    Medication Adjustments/Labs and Tests Ordered: Current medicines are reviewed at length with the patient today.  Concerns regarding medicines are outlined above.  Medication changes, Labs and Tests ordered today are listed in the Patient Instructions below. Patient Instructions  Medication Instructions:  Your physician recommends that you continue on your current medications as directed. Please refer to the Current Medication list given to you today.   Labwork: Your physician recommends that you return for lab work in: Today   Testing/Procedures: Your physician has requested that you have a cardiac catheterization. Cardiac catheterization is used to diagnose and/or treat various heart conditions. Doctors may recommend this procedure for a number of different reasons. The most common reason is to evaluate chest pain. Chest pain  can be a symptom of coronary artery disease (CAD), and cardiac catheterization can show whether plaque is narrowing or blocking your heart's arteries. This procedure is also used to evaluate the valves, as well as measure the blood flow and oxygen levels in different parts of your heart. For further information please visit [email or web address]. Please follow instruction sheet, as given.    Follow-Up: Your physician recommends that you schedule a follow-up appointment in: 4-6 Weeks    Any Other Special Instructions Will Be Listed Below (If Applicable).     If you need a refill on your cardiac medications before your next appointment, please call your pharmacy. Thank you for choosing [facility]!     [facility]ARD[facility]ULAR [facility] [address] [place] [id number] Dept: [phone] Loc: [phone]  [name]  09/19/2017  You are scheduled for a Cardiac Catheterization on Tuesday, August 20 with Dr. Lance MussJayadeep Varanasi.  1. Please arrive at the [facility] (Main Entrance A) at [facility]: [address] [place], [place] [address] at 5:30 AM (This time is two hours before your procedure to ensure your preparation). Free valet parking service is available.   Special note: Every effort is made to have your procedure done on time. Please understand that emergencies sometimes delay scheduled procedures.  2. Diet: Do not eat solid foods after midnight.  The patient may have clear liquids until 5am upon the day of the procedure.  3. Labs: You will need to have blood drawn on Friday, August 16 at [facility] [address], [place]  Open: 7am - 6pm, Sat 8am - 12 noon   Phone: [phone]. You do not need to be fasting.  4. Medication instructions in preparation for your procedure:   Contrast Allergy: Yes, Please take Prednisone 50mg  by mouth at: Thirteen hours prior to cath 6:30 pm Seven hours  prior to cath 12:00am on Tuesday And prior to leaving home please take last dose of Prednisone 50mg  and Benadryl 50mg  by mouth.  On the morning  of your procedure, take your Aspirin and any morning medicines NOT listed above.  You may use sips of water.  5. Plan for one night stay--bring personal belongings. 6. Bring a current list of your medications and current insurance cards. 7. You MUST have a responsible person to drive you home. 8. Someone MUST be with you the first 24 hours after you arrive home or your discharge will be delayed. 9. Please wear clothes that are easy to get on and off and wear slip-on shoes.  Thank you for allowing [place] to care for you!   -- [facility]    Signed, Ellsworth Lennox, PA-C  09/20/2017 9:22 AM    [facility] [address] [place], [place] [address] Phone: [phone]

## 2017-09-19 NOTE — Patient Instructions (Signed)
Medication Instructions:  Your physician recommends that you continue on your current medications as directed. Please refer to the Current Medication list given to you today.   Labwork: Your physician recommends that you return for lab work in: Today   Testing/Procedures: Your physician has requested that you have a cardiac catheterization. Cardiac catheterization is used to diagnose and/or treat various heart conditions. Doctors may recommend this procedure for a number of different reasons. The most common reason is to evaluate chest pain. Chest pain can be a symptom of coronary artery disease (CAD), and cardiac catheterization can show whether plaque is narrowing or blocking your heart's arteries. This procedure is also used to evaluate the valves, as well as measure the blood flow and oxygen levels in different parts of your heart. For further information please visit [email or web address]. Please follow instruction sheet, as given.    Follow-Up: Your physician recommends that you schedule a follow-up appointment in: 4-6 Weeks    Any Other Special Instructions Will Be Listed Below (If Applicable).     If you need a refill on your cardiac medications before your next appointment, please call your pharmacy. Thank you for choosing [facility] [facility]!     [name] CARDIOVASCULAR DIVISION [facility] [address] [place] [id number] Dept: [phone] Loc: [phone]  [name]  09/19/2017  You are scheduled for a Cardiac Catheterization on Tuesday, August 20 with Dr. Lance MussJayadeep Varanasi.  1. [facility] at [facility]: [address] [place], [place] [address] at 5:30 AM (This time is two hours before your procedure to ensure your preparation). Free valet parking service is available.   Special note: Every effort is made to have your procedure done on time. Please understand that  emergencies sometimes delay scheduled procedures.  2. Diet: Do not eat solid foods after midnight.  The patient may have clear liquids until 5am upon the day of the procedure.  3. Labs: You will need to have blood drawn on Friday, August 16 at [facility] [address], [place]  Open: 7am - 6pm, Sat 8am - 12 noon   Phone: [phone]. You do not need to be fasting.  4. Medication instructions in preparation for your procedure:   Contrast Allergy: Yes, Please take Prednisone 50mg  by mouth at: Thirteen hours prior to cath 6:30 pm Seven hours prior to cath 12:00am on Tuesday And prior to leaving home please take last dose of Prednisone 50mg  and Benadryl 50mg  by mouth.  On the morning of your procedure, take your Aspirin and any morning medicines NOT listed above.  You may use sips of water.  5. Plan for one night stay--bring personal belongings. 6. Bring a current list of your medications and current insurance cards. 7. You MUST have a responsible person to drive you home. 8. Someone MUST be with you the first 24 hours after you arrive home or your discharge will be delayed. 9. Please wear clothes that are easy to get on and off and wear slip-on shoes.  Thank you for allowing us to care for you!   -- [place] [facility]

## 2017-09-19 NOTE — H&P (View-Only) (Signed)
Cardiology Office Note    Date:  09/20/2017   ID:  [name], DOB January 13, 1961, MRN [id number]  PCP:  Elfredia Nevins, MD  Cardiologist: Nona Dell, MD    Chief Complaint  Patient presents with  . Hospitalization Follow-up    History of Present Illness:    KEYAN FOLSON is a 57 y.o. male with past medical history of HTN, OSA, and recent lumbar laminectomy and microdiscectomy in 05/2017 who presents to the office today for hospital follow-up and to review results from his recent stress test.   He was admitted to [facility] on 08/26/2017 for evaluation of chest pain which started along his epigastric region but radiated to his left arm. Symptoms occurred at rest and lasted for over 2 hours before spontaneously resolving. Was not worse with exertion or positional changes. Initial and cyclic troponin values were negative and EKG showed no acute ST changes when compared to prior tracings. A VQ Scan had been performed at the time of admission to rule-out a PE (scan was negative), therefore a NST could not be obtained during admission. This was performed as an outpatient on 09/02/2017 and showed evidence of prior inferior/inferoseptal/septal/apical myocardial infarction with mild to moderate peri-infarct ischemia with significant adjacent gut radiotracer uptake that may affect the defect but there did appear to be some relative hypokinesis suggesting the defect truly does represent prior infarct with mild to moderate ischemia. Given his stress test findings, this was reviewed with Dr. Diona Browner and a cardiac catheterization was recommended for definitive evaluation.   In talking with the patient today, he does report occasional episodes of chest discomfort over the past 2 weeks which can occur at rest or with activity and typically lasts for 15 to 20 minutes before spontaneously resolving. He has been carrying sublingual nitroglycerin with him but has been afraid to use this due to hearing  about possible side effects. He reports associated dyspnea with the episodes but denies any radiating pain, nausea, vomiting, or diaphoresis. Denies any episodes over the past few days leading up to his visit.   No recent palpitations, orthopnea, PND, or lower extremity edema. No planned upcoming surgeries or procedures.    Past Medical History:  Diagnosis Date  . Arthritis    "right knee"  . Depression   . Dysrhythmia   . GERD (gastroesophageal reflux disease)   . High cholesterol   . History of kidney stones    "33 years ago"  . Hypertension   . Irregular heart rhythm    a. PVC's by prior event monitor.   . Pneumonia   . Sleep apnea    wears CPAP    Past Surgical History:  Procedure Laterality Date  . BACK SURGERY    . HERNIA REPAIR    . KNEE SURGERY Right    x2  . LUMBAR LAMINECTOMY/DECOMPRESSION MICRODISCECTOMY N/A 05/08/2017   Procedure: Microlumbar decompression Lumbar two-three;  Surgeon: Jene Every, MD;  Location: [facility] OR;  Service: Orthopedics;  Laterality: N/A;  . LUMBAR LAMINECTOMY/DECOMPRESSION MICRODISCECTOMY N/A 05/16/2017   Procedure: REVISION LUMBAR DECOMPRESSION LUMBAR TWO TO LUMBAR THREE;  Surgeon: Jene Every, MD;  Location: [facility] ORS;  Service: Orthopedics;  Laterality: N/A;    Current Medications: Outpatient Medications Prior to Visit  Medication Sig Dispense Refill  . aspirin EC 81 MG tablet Take 1 tablet (81 mg total) by mouth daily. Resume 4 days post-op (Patient taking differently: Take 81 mg by mouth every morning. )    . docusate sodium (  COLACE) 100 MG capsule Take 1 capsule (100 mg total) by mouth 2 (two) times daily. (Patient taking differently: Take 100 mg by mouth 2 (two) times daily as needed for mild constipation or moderate constipation. ) 40 capsule 0  . escitalopram (LEXAPRO) 20 MG tablet Take 20 mg by mouth every morning.     . gabapentin (NEURONTIN) 300 MG capsule Take 1 capsule (300 mg total) by mouth 3 (three) times daily. (Patient  taking differently: Take 900 mg by mouth 3 (three) times daily. ) 90 capsule 1  . methocarbamol (ROBAXIN) 500 MG tablet Take 1 tablet (500 mg total) by mouth every 6 (six) hours as needed for muscle spasms. (Patient taking differently: Take 500 mg by mouth every morning. ) 40 tablet 1  . nitroGLYCERIN (NITROSTAT) 0.4 MG SL tablet Place 1 tablet (0.4 mg total) under the tongue every 5 (five) minutes as needed for chest pain. 25 tablet 2  . polyethylene glycol (MIRALAX / GLYCOLAX) packet Take 17 g by mouth daily as needed for mild constipation. 14 each 0  . quinapril (ACCUPRIL) 20 MG tablet Take 20 mg by mouth every morning.     . diltiazem (CARDIZEM CD) 120 MG 24 hr capsule Take 1 capsule (120 mg total) by mouth daily. (Patient taking differently: Take 120 mg by mouth every morning. ) 30 capsule 0   No facility-administered medications prior to visit.      Allergies:   Iodinated diagnostic agents and Iohexol   Social History   Socioeconomic History  . Marital status: Married    Spouse name: Not on file  . Number of children: Not on file  . Years of education: Not on file  . Highest education level: Not on file  Occupational History  . Not on file  Social Needs  . Financial resource strain: Not on file  . Food insecurity:    Worry: Not on file    Inability: Not on file  . Transportation needs:    Medical: Not on file    Non-medical: Not on file  Tobacco Use  . Smoking status: Never Smoker  . Smokeless tobacco: Never Used  Substance and Sexual Activity  . Alcohol use: Yes    Alcohol/week: 0.0 standard drinks    Comment: 3-4 beers a month "if that"  . Drug use: No  . Sexual activity: Not on file  Lifestyle  . Physical activity:    Days per week: Not on file    Minutes per session: Not on file  . Stress: Not on file  Relationships  . Social connections:    Talks on phone: Not on file    Gets together: Not on file    Attends religious service: Not on file    Active member  of club or organization: Not on file    Attends meetings of clubs or organizations: Not on file    Relationship status: Not on file  Other Topics Concern  . Not on file  Social History Narrative  . Not on file     Family History:  The patient's family history includes Coronary artery disease in his father; Diabetes in his father; Heart disease in his mother.   Review of Systems:   Please see the history of present illness.     General:  No chills, fever, night sweats or weight changes.  Cardiovascular:  No dyspnea on exertion, edema, orthopnea, palpitations, paroxysmal nocturnal dyspnea. Positive for chest pain (no current symptoms).  Dermatological: No rash, lesions/masses  Respiratory: No cough, dyspnea Urologic: No hematuria, dysuria Abdominal:   No nausea, vomiting, diarrhea, bright red blood per rectum, melena, or hematemesis Neurologic:  No visual changes, wkns, changes in mental status. All other systems reviewed and are otherwise negative except as noted above.   Physical Exam:    VS:  BP 132/62 (BP Location: Left Arm)   Pulse (!) 56   Ht 6\' 3"  (1.905 m)   Wt 296 lb (134.3 kg)   SpO2 95%   BMI 37.00 kg/m    General: Well developed, well nourished Caucasian male appearing in no acute distress. Head: Normocephalic, atraumatic, sclera non-icteric, no xanthomas, nares are without discharge.  Neck: No carotid bruits. JVD not elevated.  Lungs: Respirations regular and unlabored, without wheezes or rales.  Heart: Regular rate and rhythm. No S3 or S4.  No murmur, no rubs, or gallops appreciated. Abdomen: Soft, non-tender, non-distended with normoactive bowel sounds. No hepatomegaly. No rebound/guarding. No obvious abdominal masses. Msk:  Strength and tone appear normal for age. No joint deformities or effusions. Extremities: No clubbing or cyanosis. No lower extremity edema.  Distal pedal pulses are 2+ bilaterally. Neuro: Alert and oriented X 3. Moves all extremities  spontaneously. No focal deficits noted. Psych:  Responds to questions appropriately with a normal affect. Skin: No rashes or lesions noted  Wt Readings from Last 3 Encounters:  09/19/17 296 lb (134.3 kg)  08/26/17 290 lb (131.5 kg)  05/16/17 294 lb (133.4 kg)     Studies/Labs Reviewed:   EKG:  EKG is not ordered today.   Recent Labs: 09/19/2017: BUN 11; Creatinine, Ser 1.03; Hemoglobin 16.0; Platelets 184; Potassium 4.0; Sodium 135   Lipid Panel    Component Value Date/Time   CHOL 196 08/27/2017 0914   TRIG 143 08/27/2017 0914   HDL 33 (L) 08/27/2017 0914   CHOLHDL 5.9 08/27/2017 0914   VLDL 29 08/27/2017 0914   LDLCALC 134 (H) 08/27/2017 0914    Additional studies/ records that were reviewed today include:   NST: 09/02/2017  There was no ST segment deviation noted during stress.  The left ventricular ejection fraction is normal (55-65%).  Findings consistent with prior inferior/inferoseptal/septal/apical myocardial infarction with mild to moderate peri-infarct ischemia. There is significant adjacent gut radiotracer uptake that may affect the defect. There does appear to be some relative hypokinesis suggesting the defect truly does represent prior infarct with mild to moderate ischemia.  Low to intermediate risk study.  Assessment:    1. Chest pain, unspecified type   2. Abnormal nuclear stress test   3. Essential hypertension   4. OSA (obstructive sleep apnea)      Plan:   In order of problems listed above:  1. Chest Pain/ Abnormal Stress Test - recently admitted for chest pain which was thought to be atypical for a cardiac etiology but outpatient stress testing showed evidence of prior inferior/inferoseptal/septal/apical myocardial infarction with mild to moderate peri-infarct ischemia as outlined above.  - he has continued to experience episodes of chest pain over the past few weeks which have both typical and atypical features as symptoms can occur at rest or  with activity and typically resolve within 15-20 minutes. No association with food consumption. Reviewed the use of SL NTG at today's visit.  - stress test results were reviewed with Dr. Diona Browner and plans are to proceed with a cardiac catheterization given his recent symptoms, stress test results, and risk factors. The patient understands that risks include but are not limited to stroke (  1 in 1000), death (1 in 1000), kidney failure [usually temporary] (1 in 500), bleeding (1 in 200), allergic reaction [possibly serious] (1 in 200). Will pre-treat for contrast allergy with Prednisone and Benadryl as he reports a history of contrast allergy (unable to provide specifics about his reaction). Will obtain pre-procedure labs today.  - continue ASA and ACE-I. Not on BB therapy given baseline bradycardia. Pending cath results, will need to consider initiation of statin therapy (LDL 134 when checked during recent admission).   2. HTN - BP is well-controlled at 132/62 during today's visit. - continue Quinapril 20mg  daily.   3. OSA - continued compliance with CPAP encouraged.    Medication Adjustments/Labs and Tests Ordered: Current medicines are reviewed at length with the patient today.  Concerns regarding medicines are outlined above.  Medication changes, Labs and Tests ordered today are listed in the Patient Instructions below. Patient Instructions  Medication Instructions:  Your physician recommends that you continue on your current medications as directed. Please refer to the Current Medication list given to you today.   Labwork: Your physician recommends that you return for lab work in: Today   Testing/Procedures: Your physician has requested that you have a cardiac catheterization. Cardiac catheterization is used to diagnose and/or treat various heart conditions. Doctors may recommend this procedure for a number of different reasons. The most common reason is to evaluate chest pain. Chest pain  can be a symptom of coronary artery disease (CAD), and cardiac catheterization can show whether plaque is narrowing or blocking your heart's arteries. This procedure is also used to evaluate the valves, as well as measure the blood flow and oxygen levels in different parts of your heart. For further information please visit [email or web address]. Please follow instruction sheet, as given.    Follow-Up: Your physician recommends that you schedule a follow-up appointment in: 4-6 Weeks    Any Other Special Instructions Will Be Listed Below (If Applicable).     If you need a refill on your cardiac medications before your next appointment, please call your pharmacy. Thank you for choosing [facility]!     [facility] [address] [place] [id number] Dept: [phone] Loc: [phone]  Marisa HuaRichard L Pickart  09/19/2017  You are scheduled for a Cardiac Catheterization on Tuesday, August 20 with Dr. Lance MussJayadeep Varanasi.  1. [facility] A[facility] at [facility]: [address] [place], [place] [address] at 5:30 AM (This time is two hours before your procedure to ensure your preparation). Free valet parking service is available.   Special note: Every effort is made to have your procedure done on time. Please understand that emergencies sometimes delay scheduled procedures.  2. Diet: Do not eat solid foods after midnight.  The patient may have clear liquids until 5am upon the day of the procedure.  3. Labs: You will need to have blood drawn on Friday, August 16 at [facility] [address], [place]  Open: 7am - 6pm, Sat 8am - 12 noon   Phone: [phone]. You do not need to be fasting.  4. Medication instructions in preparation for your procedure:   Contrast Allergy: Yes, Please take Prednisone 50mg  by mouth at: Thirteen hours prior to cath 6:30 pm Seven hours  prior to cath 12:00am on Tuesday And prior to leaving home please take last dose of Prednisone 50mg  and Benadryl 50mg  by mouth.  On the morning  of your procedure, take your Aspirin and any morning medicines NOT listed above.  You may use sips of water.  5. Plan for one night stay--bring personal belongings. 6. Bring a current list of your medications and current insurance cards. 7. You MUST have a responsible person to drive you home. 8. Someone MUST be with you the first 24 hours after you arrive home or your discharge will be delayed. 9. Please wear clothes that are easy to get on and off and wear slip-on shoes.  Thank you for allowing [place] to care for you!   -- [facility]    Signed, Ellsworth Lennox, PA-C  09/20/2017 9:22 AM    [facility] [address] [place], [place] [address] Phone: [phone]

## 2017-09-20 ENCOUNTER — Encounter: Payer: Self-pay | Admitting: Student

## 2017-09-22 ENCOUNTER — Telehealth: Payer: Self-pay | Admitting: *Deleted

## 2017-09-22 NOTE — Telephone Encounter (Signed)
Pt contacted pre-catheterization scheduled at [facility] for: Tuesday September 23, 2017 7:30 AM Verified arrival time and place: [facility] Main Entrance A at: 5:30 AM  No solid food after midnight prior to cath, clear liquids until 5 AM day of procedure. Verified contrast allergy-reviewed instructions for 13 hour Prednisone and Benadryl Prep: Prednisone 50 mg -09/22/17 6:30 PM Prednisone 50 mg -09/23/08 12:30 AM Prednisone and Benadryl 50 mg - just prior to leaving for hospital 09/23/17   AM meds can be  taken pre-cath with sip of water including: ASA 81 mg Prednisone 50 mg Benadryl 50 mg  Confirmed patient has responsible person to drive home post procedure and for 24 hours after you arrive home: yes

## 2017-09-23 ENCOUNTER — Other Ambulatory Visit: Payer: Self-pay

## 2017-09-23 ENCOUNTER — Ambulatory Visit (HOSPITAL_COMMUNITY)
Admission: RE | Admit: 2017-09-23 | Discharge: 2017-09-23 | Disposition: A | Discharge status: Home / Self Care | Payer: 59 | Source: Ambulatory Visit | Attending: Interventional Cardiology | Admitting: Interventional Cardiology

## 2017-09-23 ENCOUNTER — Encounter (HOSPITAL_COMMUNITY): Payer: Self-pay | Admitting: Interventional Cardiology

## 2017-09-23 ENCOUNTER — Encounter (HOSPITAL_COMMUNITY)
Admission: RE | Disposition: A | Discharge status: Home / Self Care | Payer: Self-pay | Source: Ambulatory Visit | Attending: Interventional Cardiology

## 2017-09-23 DIAGNOSIS — Z87442 Personal history of urinary calculi: Secondary | ICD-10-CM | POA: Insufficient documentation

## 2017-09-23 DIAGNOSIS — G4733 Obstructive sleep apnea (adult) (pediatric): Secondary | ICD-10-CM | POA: Insufficient documentation

## 2017-09-23 DIAGNOSIS — Z9889 Other specified postprocedural states: Secondary | ICD-10-CM | POA: Diagnosis not present

## 2017-09-23 DIAGNOSIS — I252 Old myocardial infarction: Secondary | ICD-10-CM | POA: Diagnosis not present

## 2017-09-23 DIAGNOSIS — R079 Chest pain, unspecified: Secondary | ICD-10-CM

## 2017-09-23 DIAGNOSIS — F329 Major depressive disorder, single episode, unspecified: Secondary | ICD-10-CM | POA: Insufficient documentation

## 2017-09-23 DIAGNOSIS — Z8249 Family history of ischemic heart disease and other diseases of the circulatory system: Secondary | ICD-10-CM | POA: Diagnosis not present

## 2017-09-23 DIAGNOSIS — E7800 Pure hypercholesterolemia, unspecified: Secondary | ICD-10-CM | POA: Insufficient documentation

## 2017-09-23 DIAGNOSIS — I10 Essential (primary) hypertension: Secondary | ICD-10-CM | POA: Insufficient documentation

## 2017-09-23 DIAGNOSIS — I25119 Atherosclerotic heart disease of native coronary artery with unspecified angina pectoris: Secondary | ICD-10-CM | POA: Diagnosis not present

## 2017-09-23 DIAGNOSIS — Z79899 Other long term (current) drug therapy: Secondary | ICD-10-CM | POA: Diagnosis not present

## 2017-09-23 DIAGNOSIS — Z8673 Personal history of transient ischemic attack (TIA), and cerebral infarction without residual deficits: Secondary | ICD-10-CM | POA: Insufficient documentation

## 2017-09-23 DIAGNOSIS — Z91041 Radiographic dye allergy status: Secondary | ICD-10-CM | POA: Diagnosis not present

## 2017-09-23 DIAGNOSIS — K219 Gastro-esophageal reflux disease without esophagitis: Secondary | ICD-10-CM | POA: Diagnosis not present

## 2017-09-23 DIAGNOSIS — Z981 Arthrodesis status: Secondary | ICD-10-CM | POA: Insufficient documentation

## 2017-09-23 DIAGNOSIS — Z888 Allergy status to other drugs, medicaments and biological substances status: Secondary | ICD-10-CM | POA: Insufficient documentation

## 2017-09-23 DIAGNOSIS — Z7982 Long term (current) use of aspirin: Secondary | ICD-10-CM | POA: Insufficient documentation

## 2017-09-23 DIAGNOSIS — R9439 Abnormal result of other cardiovascular function study: Secondary | ICD-10-CM | POA: Insufficient documentation

## 2017-09-23 DIAGNOSIS — M1711 Unilateral primary osteoarthritis, right knee: Secondary | ICD-10-CM | POA: Insufficient documentation

## 2017-09-23 HISTORY — PX: LEFT HEART CATH AND CORONARY ANGIOGRAPHY: CATH118249

## 2017-09-23 SURGERY — LEFT HEART CATH AND CORONARY ANGIOGRAPHY
Anesthesia: LOCAL

## 2017-09-23 MED ORDER — LIDOCAINE HCL (PF) 1 % IJ SOLN
INTRAMUSCULAR | Status: AC
  Filled 2017-09-23: qty 30

## 2017-09-23 MED ORDER — HEPARIN (PORCINE) IN NACL 1000-0.9 UT/500ML-% IV SOLN
INTRAVENOUS | Status: DC | PRN
  Administered 2017-09-23 (×2): 500 mL

## 2017-09-23 MED ORDER — ACETAMINOPHEN 325 MG PO TABS: 650.0000 mg | ORAL_TABLET | ORAL | Status: DC | PRN

## 2017-09-23 MED ORDER — HEPARIN SODIUM (PORCINE) 1000 UNIT/ML IJ SOLN
INTRAMUSCULAR | Status: DC | PRN
  Administered 2017-09-23: 6000 [IU] via INTRAVENOUS

## 2017-09-23 MED ORDER — VERAPAMIL HCL 2.5 MG/ML IV SOLN
INTRAVENOUS | Status: AC
  Filled 2017-09-23: qty 2

## 2017-09-23 MED ORDER — MIDAZOLAM HCL 2 MG/2ML IJ SOLN
INTRAMUSCULAR | Status: AC
  Filled 2017-09-23: qty 2

## 2017-09-23 MED ORDER — SODIUM CHLORIDE 0.9 % WEIGHT BASED INFUSION: 1.0000 mL/kg/h | INTRAVENOUS | Status: DC

## 2017-09-23 MED ORDER — ONDANSETRON HCL 4 MG/2ML IJ SOLN: 4.0000 mg | Freq: Four times a day (QID) | INTRAMUSCULAR | Status: DC | PRN

## 2017-09-23 MED ORDER — SODIUM CHLORIDE 0.9% FLUSH: 3.0000 mL | INTRAVENOUS | Status: DC | PRN

## 2017-09-23 MED ORDER — SODIUM CHLORIDE 0.9 % IV SOLN: INTRAVENOUS | Status: AC

## 2017-09-23 MED ORDER — FENTANYL CITRATE (PF) 100 MCG/2ML IJ SOLN
INTRAMUSCULAR | Status: AC
  Filled 2017-09-23: qty 2

## 2017-09-23 MED ORDER — HEPARIN SODIUM (PORCINE) 1000 UNIT/ML IJ SOLN
INTRAMUSCULAR | Status: AC
  Filled 2017-09-23: qty 1

## 2017-09-23 MED ORDER — IOHEXOL 350 MG/ML SOLN
INTRAVENOUS | Status: DC | PRN
  Administered 2017-09-23: 50 mL via INTRACARDIAC

## 2017-09-23 MED ORDER — LIDOCAINE HCL (PF) 1 % IJ SOLN
INTRAMUSCULAR | Status: DC | PRN
  Administered 2017-09-23: 2 mL via INTRADERMAL

## 2017-09-23 MED ORDER — VERAPAMIL HCL 2.5 MG/ML IV SOLN
INTRAVENOUS | Status: DC | PRN
  Administered 2017-09-23: 08:00:00 via INTRA_ARTERIAL

## 2017-09-23 MED ORDER — ASPIRIN 81 MG PO CHEW
81.0000 mg | CHEWABLE_TABLET | ORAL | Status: AC
  Administered 2017-09-23: 81 mg via ORAL
  Filled 2017-09-23: qty 1

## 2017-09-23 MED ORDER — HEPARIN (PORCINE) IN NACL 1000-0.9 UT/500ML-% IV SOLN
INTRAVENOUS | Status: AC
  Filled 2017-09-23: qty 1000

## 2017-09-23 MED ORDER — SODIUM CHLORIDE 0.9 % WEIGHT BASED INFUSION
3.0000 mL/kg/h | INTRAVENOUS | Status: DC
  Administered 2017-09-23: 3 mL/kg/h via INTRAVENOUS

## 2017-09-23 MED ORDER — SODIUM CHLORIDE 0.9% FLUSH: 3.0000 mL | Freq: Two times a day (BID) | INTRAVENOUS | Status: DC

## 2017-09-23 MED ORDER — SODIUM CHLORIDE 0.9 % IV SOLN: 250.0000 mL | INTRAVENOUS | Status: DC | PRN

## 2017-09-23 MED ORDER — MIDAZOLAM HCL 2 MG/2ML IJ SOLN
INTRAMUSCULAR | Status: DC | PRN
  Administered 2017-09-23: 2 mg via INTRAVENOUS

## 2017-09-23 MED ORDER — FENTANYL CITRATE (PF) 100 MCG/2ML IJ SOLN
INTRAMUSCULAR | Status: DC | PRN
  Administered 2017-09-23: 25 ug via INTRAVENOUS

## 2017-09-23 SURGICAL SUPPLY — 9 items

## 2017-09-23 NOTE — Interval H&P Note (Signed)
Cath Lab Visit (complete for each Cath Lab visit)  Clinical Evaluation Leading to the Procedure:   ACS: Yes.    Non-ACS:    Anginal Classification: CCS IV  Anti-ischemic medical therapy: Minimal Therapy (1 class of medications)  Non-Invasive Test Results: Intermediate-risk stress test findings: cardiac mortality 1-3%/year  Prior CABG: No previous CABG  Admitted with unstable angina initially.  Stress test followed after V/Q. Now for outpatient cath.    History and Physical Interval Note:  09/23/2017 7:35 AM  [name]  has presented today for surgery, with the diagnosis of chest pain  The various methods of treatment have been discussed with the patient and family. After consideration of risks, benefits and other options for treatment, the patient has consented to  Procedure(s): LEFT HEART CATH AND CORONARY ANGIOGRAPHY (N/A) as a surgical intervention .  The patient's history has been reviewed, patient examined, no change in status, stable for surgery.  I have reviewed the patient's chart and labs.  Questions were answered to the patient's satisfaction.     Lance MussJayadeep Seabron Iannello

## 2017-09-23 NOTE — Discharge Instructions (Signed)
Radial Site Care °Refer to this sheet in the next few weeks. These instructions provide you with information about caring for yourself after your procedure. Your health care provider may also give you more specific instructions. Your treatment has been planned according to current medical practices, but problems sometimes occur. Call your health care provider if you have any problems or questions after your procedure. °What can I expect after the procedure? °After your procedure, it is typical to have the following: °· Bruising at the radial site that usually fades within 1-2 weeks. °· Blood collecting in the tissue (hematoma) that may be painful to the touch. It should usually decrease in size and tenderness within 1-2 weeks. ° °Follow these instructions at home: °· Take medicines only as directed by your health care provider. °· You may shower 24-48 hours after the procedure or as directed by your health care provider. Remove the bandage (dressing) and gently wash the site with plain soap and water. Pat the area dry with a clean towel. Do not rub the site, because this may cause bleeding. °· Do not take baths, swim, or use a hot tub until your health care provider approves. °· Check your insertion site every day for redness, swelling, or drainage. °· Do not apply powder or lotion to the site. °· Do not flex or bend the affected arm for 24 hours or as directed by your health care provider. °· Do not push or pull heavy objects with the affected arm for 24 hours or as directed by your health care provider. °· Do not lift over 10 lb (4.5 kg) for 5 days after your procedure or as directed by your health care provider. °· Ask your health care provider when it is okay to: °? Return to work or school. °? Resume usual physical activities or sports. °? Resume sexual activity. °· Do not drive home if you are discharged the same day as the procedure. Have someone else drive you. °· You may drive 24 hours after the procedure  unless otherwise instructed by your health care provider. °· Do not operate machinery or power tools for 24 hours after the procedure. °· If your procedure was done as an outpatient procedure, which means that you went home the same day as your procedure, a responsible adult should be with you for the first 24 hours after you arrive home. °· Keep all follow-up visits as directed by your health care provider. This is important. °Contact a health care provider if: °· You have a fever. °· You have chills. °· You have increased bleeding from the radial site. Hold pressure on the site. °Get help right away if: °· You have unusual pain at the radial site. °· You have redness, warmth, or swelling at the radial site. °· You have drainage (other than a small amount of blood on the dressing) from the radial site. °· The radial site is bleeding, and the bleeding does not stop after 30 minutes of holding steady pressure on the site. °· Your arm or hand becomes pale, cool, tingly, or numb. °This information is not intended to replace advice given to you by your health care provider. Make sure you discuss any questions you have with your health care provider. °Document Released: 02/23/2010 Document Revised: 06/29/2015 Document Reviewed: 08/09/2013 °Elsevier Interactive Patient Education © 2018 [employer] ° ° ° °Moderate Conscious Sedation, Adult, Care After °These instructions provide you with information about caring for yourself after your procedure. Your health care provider   may also give you more specific instructions. Your treatment has been planned according to current medical practices, but problems sometimes occur. Call your health care provider if you have any problems or questions after your procedure. °What can I expect after the procedure? °After your procedure, it is common: °· To feel sleepy for several hours. °· To feel clumsy and have poor balance for several hours. °· To have poor judgment for several  hours. °· To vomit if you eat too soon. ° °Follow these instructions at home: °For at least 24 hours after the procedure: ° °· Do not: °? Participate in activities where you could fall or become injured. °? Drive. °? Use heavy machinery. °? Drink alcohol. °? Take sleeping pills or medicines that cause drowsiness. °? Make important decisions or sign legal documents. °? Take care of children on your own. °· Rest. °Eating and drinking °· Follow the diet recommended by your health care provider. °· If you vomit: °? Drink water, juice, or soup when you can drink without vomiting. °? Make sure you have little or no nausea before eating solid foods. °General instructions °· Have a responsible adult stay with you until you are awake and alert. °· Take over-the-counter and prescription medicines only as told by your health care provider. °· If you smoke, do not smoke without supervision. °· Keep all follow-up visits as told by your health care provider. This is important. °Contact a health care provider if: °· You keep feeling nauseous or you keep vomiting. °· You feel light-headed. °· You develop a rash. °· You have a fever. °Get help right away if: °· You have trouble breathing. °This information is not intended to replace advice given to you by your health care provider. Make sure you discuss any questions you have with your health care provider. °Document Released: 11/11/2012 Document Revised: 06/26/2015 Document Reviewed: 05/13/2015 °Elsevier Interactive Patient Education © 2018 [employer] ° °

## 2017-10-23 ENCOUNTER — Ambulatory Visit: Payer: 59 | Admitting: Student

## 2017-10-23 ENCOUNTER — Encounter: Payer: Self-pay | Admitting: Student

## 2017-10-23 VITALS — BP 128/82 | HR 57 | Ht 75.0 in | Wt 297.6 lb

## 2017-10-23 DIAGNOSIS — I10 Essential (primary) hypertension: Secondary | ICD-10-CM | POA: Diagnosis not present

## 2017-10-23 DIAGNOSIS — R9439 Abnormal result of other cardiovascular function study: Secondary | ICD-10-CM | POA: Diagnosis not present

## 2017-10-23 DIAGNOSIS — R0600 Dyspnea, unspecified: Secondary | ICD-10-CM | POA: Diagnosis not present

## 2017-10-23 DIAGNOSIS — G4733 Obstructive sleep apnea (adult) (pediatric): Secondary | ICD-10-CM | POA: Diagnosis not present

## 2017-10-23 MED ORDER — METHOCARBAMOL 500 MG PO TABS: 750.0000 mg | ORAL_TABLET | Freq: Every day | ORAL | 1 refills | Status: DC

## 2017-10-23 NOTE — Progress Notes (Signed)
Cardiology Office Note    Date:  10/23/2017   ID:  [name], DOB 24-Nov-1960, MRN [id number]  PCP:  Elfredia Nevins, MD  Cardiologist: Nona Dell, MD    Chief Complaint  Patient presents with  . Follow-up    s/p cardiac catheterization    History of Present Illness:    [name] is a 57 y.o. male with past medical history of HTN, OSA (on CPAP), and recent abnormal stress test who presents to the office today for follow-up of his recent cardiac catheterization.  He was last examined by myself on 09/19/2017 for follow-up of a recent stress test which showed evidence of prior inferior/inferoseptal/septal/apical myocardial infarction with mild to moderate peri-infarct ischemia. He denied any recurrent chest pain at the time of his office visit but given stress test findings, a cardiac catheterization was recommended for definitive evaluation. This was performed by Dr. Eldridge Dace on 09/23/2017 and showed mild nonobstructive CAD with 10% proximal LAD stenosis and 10% mid LCx stenosis with a preserved EF of 55 to 65%.  In talking with the patient and his wife today, he denies any repeat episodes of exertional chest pain.  He does report intermittently feeling as though he cannot catch his breath which mostly occurs at rest and lasts for several minutes and spontaneously resolves. He denies any specific dyspnea on exertion, orthopnea, PND, or lower extremity edema. Imaging during recent admission showed no evidence of a PE. He does report being less active at baseline due to chronic back pain and has  No complications regarding his recent cardiac catheterization.  He does not check blood pressure regularly in the ambulatory setting but it is well controlled at 128/82 during today's visit.   Past Medical History:  Diagnosis Date  . Arthritis    "right knee"  . Depression   . Dysrhythmia   . GERD (gastroesophageal reflux disease)   . H/O cardiac catheterization    a.   09/23/2017: cath showed mild nonobstructive CAD with 10% proximal LAD stenosis and 10% mid LCx stenosis with a preserved EF of 55 to 65%.  . High cholesterol   . History of kidney stones    "33 years ago"  . Hypertension   . Irregular heart rhythm    a. PVC's by prior event monitor.   . Pneumonia   . Sleep apnea    wears CPAP    Past Surgical History:  Procedure Laterality Date  . BACK SURGERY    . HERNIA REPAIR    . KNEE SURGERY Right    x2  . LEFT HEART CATH AND CORONARY ANGIOGRAPHY N/A 09/23/2017   Procedure: LEFT HEART CATH AND CORONARY ANGIOGRAPHY;  Surgeon: Corky Crafts, MD;  Location: [facility] INVASIVE CV LAB;  Service: Cardiovascular;  Laterality: N/A;  . LUMBAR LAMINECTOMY/DECOMPRESSION MICRODISCECTOMY N/A 05/08/2017   Procedure: Microlumbar decompression Lumbar two-three;  Surgeon: Jene Every, MD;  Location: [facility] OR;  Service: Orthopedics;  Laterality: N/A;  . LUMBAR LAMINECTOMY/DECOMPRESSION MICRODISCECTOMY N/A 05/16/2017   Procedure: REVISION LUMBAR DECOMPRESSION LUMBAR TWO TO LUMBAR THREE;  Surgeon: Jene Every, MD;  Location: [facility] ORS;  Service: Orthopedics;  Laterality: N/A;    Current Medications: Outpatient Medications Prior to Visit  Medication Sig Dispense Refill  . aspirin EC 81 MG tablet Take 1 tablet (81 mg total) by mouth daily. Resume 4 days post-op (Patient taking differently: Take 81 mg by mouth every morning. )    . escitalopram (LEXAPRO) 20 MG tablet Take 20 mg by mouth  every morning.     . gabapentin (NEURONTIN) 300 MG capsule Take 1 capsule (300 mg total) by mouth 3 (three) times daily. (Patient taking differently: Take 900 mg by mouth 3 (three) times daily. ) 90 capsule 1  . nitroGLYCERIN (NITROSTAT) 0.4 MG SL tablet Place 1 tablet (0.4 mg total) under the tongue every 5 (five) minutes as needed for chest pain. 25 tablet 2  . quinapril (ACCUPRIL) 20 MG tablet Take 20 mg by mouth every morning.     . methocarbamol (ROBAXIN) 500 MG tablet Take 1 tablet  (500 mg total) by mouth every 6 (six) hours as needed for muscle spasms. (Patient taking differently: Take 500 mg by mouth every morning. ) 40 tablet 1  . docusate sodium (COLACE) 100 MG capsule Take 1 capsule (100 mg total) by mouth 2 (two) times daily. (Patient taking differently: Take 100 mg by mouth 2 (two) times daily as needed for mild constipation or moderate constipation. ) 40 capsule 0  . polyethylene glycol (MIRALAX / GLYCOLAX) packet Take 17 g by mouth daily as needed for mild constipation. 14 each 0  . predniSONE (DELTASONE) 50 MG tablet Take one Tablet Thirteen hours prior to cath 6:30 pm Take one Tablet Seven hours prior to cath 12:00am on Tuesday And prior to leaving home please take last dose of Prednisone 50 mg 3 tablet 0   No facility-administered medications prior to visit.      Allergies:   Iodinated diagnostic agents and Iohexol   Social History   Socioeconomic History  . Marital status: Married    Spouse name: Not on file  . Number of children: Not on file  . Years of education: Not on file  . Highest education level: Not on file  Occupational History  . Not on file  Social Needs  . Financial resource strain: Not on file  . Food insecurity:    Worry: Not on file    Inability: Not on file  . Transportation needs:    Medical: Not on file    Non-medical: Not on file  Tobacco Use  . Smoking status: Never Smoker  . Smokeless tobacco: Never Used  Substance and Sexual Activity  . Alcohol use: Yes    Alcohol/week: 0.0 standard drinks    Comment: 3-4 beers a month "if that"  . Drug use: No  . Sexual activity: Not on file  Lifestyle  . Physical activity:    Days per week: Not on file    Minutes per session: Not on file  . Stress: Not on file  Relationships  . Social connections:    Talks on phone: Not on file    Gets together: Not on file    Attends religious service: Not on file    Active member of club or organization: Not on file    Attends meetings of  clubs or organizations: Not on file    Relationship status: Not on file  Other Topics Concern  . Not on file  Social History Narrative  . Not on file     Family History:  The patient's family history includes Coronary artery disease in his father; Diabetes in his father; Heart disease in his mother.   Review of Systems:   Please see the history of present illness.     General:  No chills, fever, night sweats or weight changes.  Cardiovascular:  No chest pain, dyspnea on exertion, edema, orthopnea, palpitations, paroxysmal nocturnal dyspnea. Dermatological: No rash, lesions/masses Respiratory: No cough,  Positive for intermittent dyspnea. Urologic: No hematuria, dysuria Abdominal:   No nausea, vomiting, diarrhea, bright red blood per rectum, melena, or hematemesis Neurologic:  No visual changes, wkns, changes in mental status. All other systems reviewed and are otherwise negative except as noted above.   Physical Exam:    VS:  BP 128/82   Pulse (!) 57   Ht 6\' 3"  (1.905 m)   Wt 297 lb 9.6 oz (135 kg)   SpO2 96%   BMI 37.20 kg/m    General: Well developed, well nourished Caucasian male appearing in no acute distress. Head: Normocephalic, atraumatic, sclera non-icteric, no xanthomas, nares are without discharge.  Neck: No carotid bruits. JVD not elevated.  Lungs: Respirations regular and unlabored, without wheezes or rales.  Heart: Regular rate and rhythm. No S3 or S4.  No murmur, no rubs, or gallops appreciated. Abdomen: Soft, non-tender, non-distended with normoactive bowel sounds. No hepatomegaly. No rebound/guarding. No obvious abdominal masses. Msk:  Strength and tone appear normal for age. No joint deformities or effusions. Extremities: No clubbing or cyanosis. No edema.  Distal pedal pulses are 2+ bilaterally. Radial site stable with no ecchymosis or evidence of a hematoma.  Neuro: Alert and oriented X 3. Moves all extremities spontaneously. No focal deficits noted. Psych:   Responds to questions appropriately with a normal affect. Skin: No rashes or lesions noted  Wt Readings from Last 3 Encounters:  10/23/17 297 lb 9.6 oz (135 kg)  09/23/17 290 lb (131.5 kg)  09/19/17 296 lb (134.3 kg)    Studies/Labs Reviewed:   EKG:  EKG is not ordered today.   Recent Labs: 09/19/2017: BUN 11; Creatinine, Ser 1.03; Hemoglobin 16.0; Platelets 184; Potassium 4.0; Sodium 135   Lipid Panel    Component Value Date/Time   CHOL 196 08/27/2017 0914   TRIG 143 08/27/2017 0914   HDL 33 (L) 08/27/2017 0914   CHOLHDL 5.9 08/27/2017 0914   VLDL 29 08/27/2017 0914   LDLCALC 134 (H) 08/27/2017 0914    Additional studies/ records that were reviewed today include:   NST: 09/02/2017  There was no ST segment deviation noted during stress.  The left ventricular ejection fraction is normal (55-65%).  Findings consistent with prior inferior/inferoseptal/septal/apical myocardial infarction with mild to moderate peri-infarct ischemia. There is significant adjacent gut radiotracer uptake that may affect the defect. There does appear to be some relative hypokinesis suggesting the defect truly does represent prior infarct with mild to moderate ischemia.  Low to intermediate risk study.  Cardiac Catheterization: 09/2017  Prox LAD lesion is 10% stenosed.  Mid Cx lesion is 10% stenosed.  LV end diastolic pressure is mildly elevated.  The left ventricular ejection fraction is 55-65% by visual estimate.    Assessment:    1. Abnormal nuclear stress test   2. Paroxysmal dyspnea   3. Essential hypertension   4. OSA (obstructive sleep apnea)      Plan:   In order of problems listed above:  1. Abnormal Stress Test/ Intermittent Dyspnea  - recent stress test was abnormal as outlined above which was a false positive as cardiac catheterization showed mild nonobstructive CAD with 10% proximal LAD stenosis and 10% mid LCx stenosis with a preserved EF of 55 to 65%. Recommended he  continue on ASA and ACE-I. Not currently on statin therapy and he should follow-up with his PCP for routine labs and consider initiation of statin therapy given documented CAD. - He continues to have intermittent dyspnea which typically occurs at rest.  VQ scan during recent admission was negative for a PE and ischemic etiology has been ruled out. EF was preserved by cath and LVEDP was only mildly elevated. He will follow-up with [facility] in regards to his CPAP as outlined below. If his symptoms persist, I recommended he follow-up with his PCP to see if a Pulmonology referral is warranted although his dyspnea could be due to deconditioning in the setting of decreased activity over the past several months.   2. HTN - BP is well controlled at 128/82 during today's visit. Continue Quinapril 20 mg daily.  3. OSA - he reports good compliance with CPAP but does feel like the machine is "suffocating him" at times.  I recommended that he follow-up with the home health company that manages his machine. His last sleep study was 10+ years ago and he might require a titration study.    Medication Adjustments/Labs and Tests Ordered: Current medicines are reviewed at length with the patient today.  Concerns regarding medicines are outlined above.  Medication changes, Labs and Tests ordered today are listed in the Patient Instructions below. Patient Instructions  Medication Instructions:  Your physician recommends that you continue on your current medications as directed. Please refer to the Current Medication list given to you today.  Labwork: none  Testing/Procedures: None  Follow-Up: Your physician wants you to follow-up in: 1 year .  You will receive a reminder letter in the mail two months in advance. If you don't receive a letter, please call our office to schedule the follow-up appointment.  Any Other Special Instructions Will Be Listed Below (If Applicable).  If you need a refill on  your cardiac medications before your next appointment, please call your pharmacy.    Signed, Ellsworth Lennox, PA-C  10/23/2017 5:45 PM    [facility] [address] [place], [place] [address] Phone: [phone]

## 2017-10-23 NOTE — Patient Instructions (Signed)
Medication Instructions:  Your physician recommends that you continue on your current medications as directed. Please refer to the Current Medication list given to you today.   Labwork: none  Testing/Procedures: None  Follow-Up: Your physician wants you to follow-up in: 1 year .  You will receive a reminder letter in the mail two months in advance. If you don't receive a letter, please call our office to schedule the follow-up appointment.   Any Other Special Instructions Will Be Listed Below (If Applicable).     If you need a refill on your cardiac medications before your next appointment, please call your pharmacy.

## 2018-02-10 DIAGNOSIS — Z6837 Body mass index (BMI) 37.0-37.9, adult: Secondary | ICD-10-CM | POA: Diagnosis not present

## 2018-02-10 DIAGNOSIS — Z1389 Encounter for screening for other disorder: Secondary | ICD-10-CM | POA: Diagnosis not present

## 2018-02-10 DIAGNOSIS — Z0000 Encounter for general adult medical examination without abnormal findings: Secondary | ICD-10-CM | POA: Diagnosis not present

## 2018-02-10 DIAGNOSIS — E782 Mixed hyperlipidemia: Secondary | ICD-10-CM | POA: Diagnosis not present

## 2018-02-10 DIAGNOSIS — I10 Essential (primary) hypertension: Secondary | ICD-10-CM | POA: Diagnosis not present

## 2018-10-27 DIAGNOSIS — M1991 Primary osteoarthritis, unspecified site: Secondary | ICD-10-CM | POA: Diagnosis not present

## 2018-10-27 DIAGNOSIS — R945 Abnormal results of liver function studies: Secondary | ICD-10-CM | POA: Diagnosis not present

## 2018-10-27 DIAGNOSIS — I10 Essential (primary) hypertension: Secondary | ICD-10-CM | POA: Diagnosis not present

## 2018-10-27 DIAGNOSIS — R413 Other amnesia: Secondary | ICD-10-CM | POA: Diagnosis not present

## 2018-10-27 DIAGNOSIS — G894 Chronic pain syndrome: Secondary | ICD-10-CM | POA: Diagnosis not present

## 2018-10-27 DIAGNOSIS — N401 Enlarged prostate with lower urinary tract symptoms: Secondary | ICD-10-CM | POA: Diagnosis not present

## 2018-10-27 DIAGNOSIS — L918 Other hypertrophic disorders of the skin: Secondary | ICD-10-CM | POA: Diagnosis not present

## 2018-10-27 DIAGNOSIS — Z6836 Body mass index (BMI) 36.0-36.9, adult: Secondary | ICD-10-CM | POA: Diagnosis not present

## 2018-10-27 DIAGNOSIS — E6609 Other obesity due to excess calories: Secondary | ICD-10-CM | POA: Diagnosis not present

## 2018-10-27 DIAGNOSIS — N400 Enlarged prostate without lower urinary tract symptoms: Secondary | ICD-10-CM | POA: Diagnosis not present

## 2018-10-27 DIAGNOSIS — Z79899 Other long term (current) drug therapy: Secondary | ICD-10-CM | POA: Diagnosis not present

## 2018-11-24 ENCOUNTER — Other Ambulatory Visit: Payer: Self-pay

## 2018-11-24 DIAGNOSIS — Z20822 Contact with and (suspected) exposure to covid-19: Secondary | ICD-10-CM

## 2018-11-25 LAB — NOVEL CORONAVIRUS, NAA: SARS-CoV-2, NAA: NOT DETECTED

## 2018-11-26 ENCOUNTER — Telehealth: Payer: Self-pay | Admitting: General Practice

## 2018-11-26 NOTE — Telephone Encounter (Signed)
Negative COVID results given. Patient results "NOT Detected." Caller expressed understanding. ° °

## 2018-12-03 DIAGNOSIS — M545 Low back pain: Secondary | ICD-10-CM | POA: Diagnosis not present

## 2018-12-03 DIAGNOSIS — M6281 Muscle weakness (generalized): Secondary | ICD-10-CM | POA: Diagnosis not present

## 2018-12-03 DIAGNOSIS — M25551 Pain in right hip: Secondary | ICD-10-CM | POA: Diagnosis not present

## 2018-12-03 DIAGNOSIS — M5416 Radiculopathy, lumbar region: Secondary | ICD-10-CM | POA: Diagnosis not present

## 2018-12-07 DIAGNOSIS — M545 Low back pain: Secondary | ICD-10-CM | POA: Diagnosis not present

## 2018-12-07 DIAGNOSIS — M6281 Muscle weakness (generalized): Secondary | ICD-10-CM | POA: Diagnosis not present

## 2018-12-07 DIAGNOSIS — M25551 Pain in right hip: Secondary | ICD-10-CM | POA: Diagnosis not present

## 2018-12-07 DIAGNOSIS — M5416 Radiculopathy, lumbar region: Secondary | ICD-10-CM | POA: Diagnosis not present

## 2019-05-19 IMAGING — CR DG LUMBAR SPINE 2-3V
2 series · 2 of 2 positions shown · non-contrast
Comparison: Lumbar spine x-rays dated February 12, 2017.

CLINICAL DATA: Degenerative disc disease.

EXAM:
LUMBAR SPINE - 2-3 VIEW

[w lumbar spine ap]
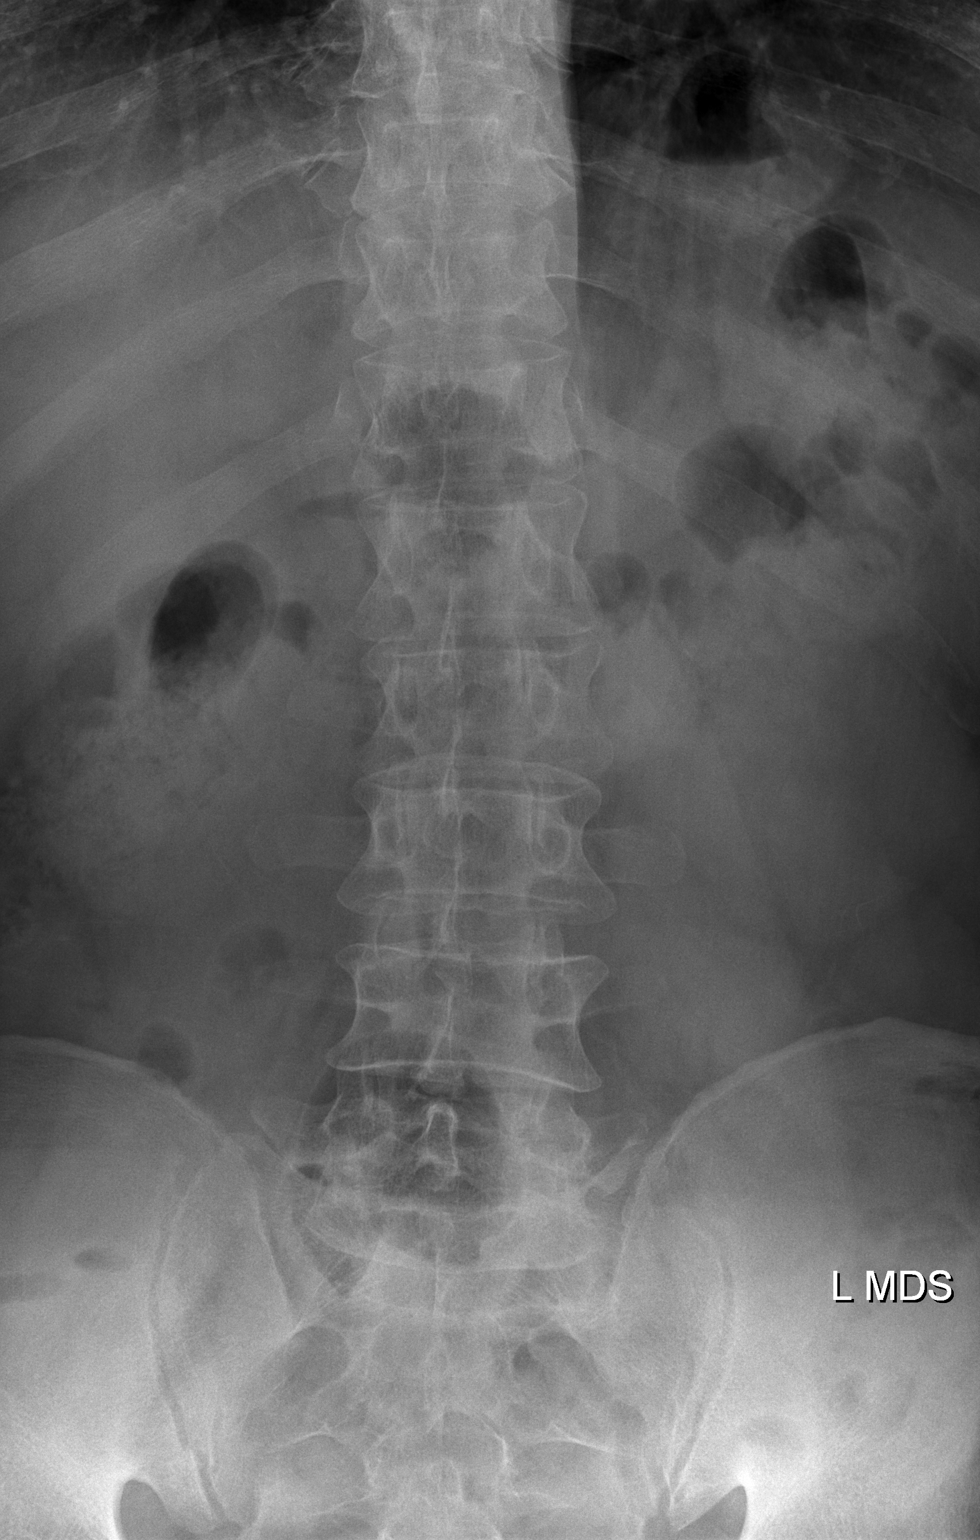

[w lumbar spine lat]
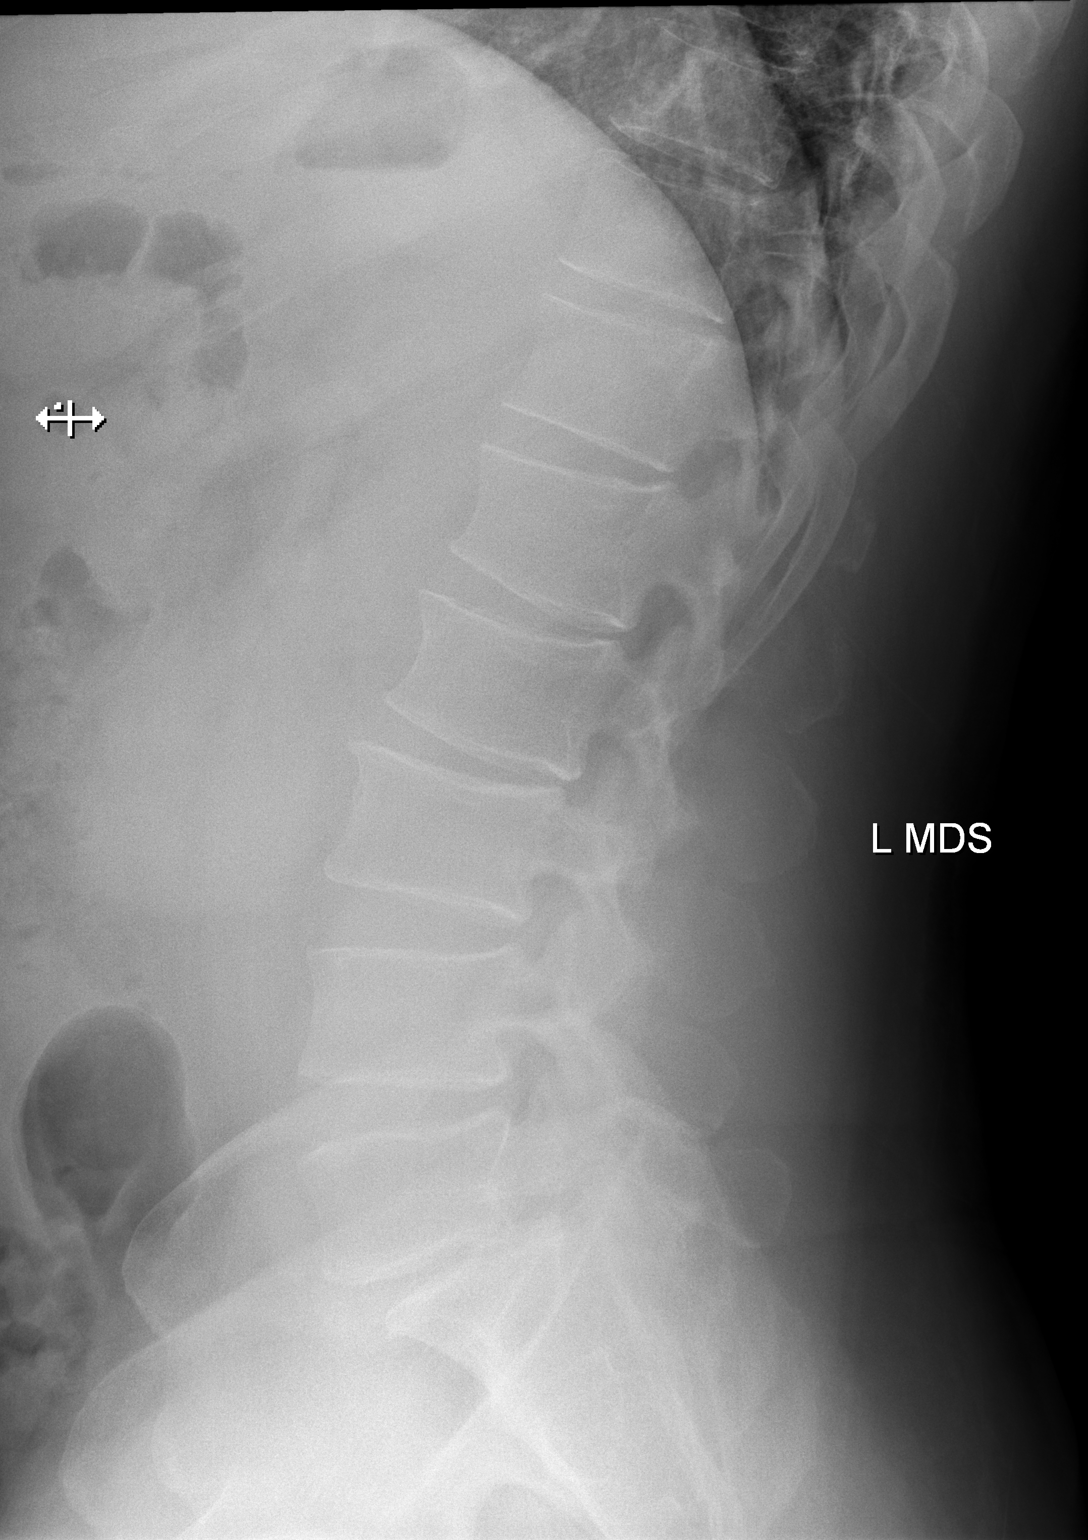

[2 of 2 positions shown; findings below may reference images not displayed]

FINDINGS: Five lumbar type vertebral bodies. No acute fracture or subluxation.
Vertebral body heights are preserved. Slightly increased grade 1
anterolisthesis at L5-S1 due to bilateral L5 pars defects. Moderate
disc height loss at L5-S1, unchanged. Mild disc height loss at L1-L2
and L2-L3. The sacroiliac joints are intact.
IMPRESSION: 1. Slightly increased grade 1 anterolisthesis at L5-S1 due to
bilateral L5 pars defects.
2. Moderate degenerative disc disease at L5-S1, unchanged.

## 2019-06-15 NOTE — Progress Notes (Signed)
Cardiology Office Note    Date:  06/16/2019   ID:  [name], DOB November 23, 1960, MRN [id number]  PCP:  Elfredia Nevins, MD  Cardiologist: Nona Dell, MD    Chief Complaint  Patient presents with  . Follow-up    Overdue Annual Visit    History of Present Illness:    [name] is a 59 y.o. male with past medical history of CAD (nonobstructive CAD by catheterization in 09/2017), HTN, and OSA (on CPAP) who presents to the office today for follow-up.   He was last examined by myself in 10/2017 for follow-up from his recent cardiac catheterization which had shown mild nonobstructive CAD with continued medical management recommended. He reported intermittent dyspnea at rest but denied any dyspnea on exertion or associated chest pain. He was continued on ASA 81mg  daily and Quinapril 20mg  daily and asked to follow-up with his PCP for routine labs in regards to HLD as he was not currently on statin therapy.   In talking with the patient today, he denies any recent exertional chest pain since his last visit. He does report some dyspnea on exertion and feels like this might be secondary to deconditioning.  He works as a but reports being sedentary with this mostly by sitting in the office.  He does report occasional palpitations and notices this when he checks his pulse in his wrist.  He consumes a few caffeinated beverages daily and reports occasional alcohol use but no recent changes in this.  He does report intermittent dizziness and worsening fatigue. Denies any actual presyncope.  Blood pressure has been elevated when checked at home over the past several months with SBP in the 150's to 180's. He has not been evaluated by his PCP in over 1 year.   Past Medical History:  Diagnosis Date  . Arthritis    "right knee"  . Depression   . Dysrhythmia   . GERD (gastroesophageal reflux disease)   . H/O cardiac catheterization    a.  09/23/2017: cath showed mild  nonobstructive CAD with 10% proximal LAD stenosis and 10% mid LCx stenosis with a preserved EF of 55 to 65%.  . High cholesterol   . History of kidney stones    "33 years ago"  . Hypertension   . Irregular heart rhythm    a. PVC's by prior event monitor.   . Pneumonia   . Sleep apnea    wears CPAP    Past Surgical History:  Procedure Laterality Date  . BACK SURGERY    . HERNIA REPAIR    . KNEE SURGERY Right    x2  . LEFT HEART CATH AND CORONARY ANGIOGRAPHY N/A 09/23/2017   Procedure: LEFT HEART CATH AND CORONARY ANGIOGRAPHY;  Surgeon: 09/25/2017, MD;  Location: [facility] INVASIVE CV LAB;  Service: Cardiovascular;  Laterality: N/A;  . LUMBAR LAMINECTOMY/DECOMPRESSION MICRODISCECTOMY N/A 05/08/2017   Procedure: Microlumbar decompression Lumbar two-three;  Surgeon: [facility], MD;  Location: [facility] OR;  Service: Orthopedics;  Laterality: N/A;  . LUMBAR LAMINECTOMY/DECOMPRESSION MICRODISCECTOMY N/A 05/16/2017   Procedure: REVISION LUMBAR DECOMPRESSION LUMBAR TWO TO LUMBAR THREE;  Surgeon: Jene Every, MD;  Location: [facility] ORS;  Service: Orthopedics;  Laterality: N/A;    Current Medications: Outpatient Medications Prior to Visit  Medication Sig Dispense Refill  . aspirin EC 81 MG tablet Take 1 tablet (81 mg total) by mouth daily. Resume 4 days post-op (Patient taking differently: Take 81 mg by mouth every morning. )    .  escitalopram (LEXAPRO) 20 MG tablet Take 20 mg by mouth every morning.     . gabapentin (NEURONTIN) 300 MG capsule Take 1 capsule (300 mg total) by mouth 3 (three) times daily. (Patient taking differently: Take 900 mg by mouth at bedtime. ) 90 capsule 1  . meloxicam (MOBIC) 7.5 MG tablet Take 15 mg by mouth daily.    . nitroGLYCERIN (NITROSTAT) 0.4 MG SL tablet Place 1 tablet (0.4 mg total) under the tongue every 5 (five) minutes as needed for chest pain. 25 tablet 2  . quinapril (ACCUPRIL) 20 MG tablet Take 20 mg by mouth every morning.     . diltiazem (TIAZAC) 120 MG  24 hr capsule diltiazem ER 120 mg capsule,24 hr,extended release   120 mg by oral route.    . methocarbamol (ROBAXIN) 500 MG tablet Take 1.5 tablets (750 mg total) by mouth daily. 40 tablet 1   No facility-administered medications prior to visit.     Allergies:   Iodinated diagnostic agents and Iohexol   Social History   Socioeconomic History  . Marital status: Married    Spouse name: Not on file  . Number of children: Not on file  . Years of education: Not on file  . Highest education level: Not on file  Occupational History  . Not on file  Tobacco Use  . Smoking status: Never Smoker  . Smokeless tobacco: Never Used  Substance and Sexual Activity  . Alcohol use: Yes    Alcohol/week: 0.0 standard drinks    Comment: 3-4 beers a month "if that"  . Drug use: No  . Sexual activity: Not on file  Other Topics Concern  . Not on file  Social History Narrative  . Not on file   Social Determinants of Health   Financial Resource Strain:   . Difficulty of Paying Living Expenses:   Food Insecurity:   . Worried About Charity fundraiser in the Last Year:   . Arboriculturist in the Last Year:   Transportation Needs:   . Film/video editor (Medical):   Marland Kitchen Lack of Transportation (Non-Medical):   Physical Activity:   . Days of Exercise per Week:   . Minutes of Exercise per Session:   Stress:   . Feeling of Stress :   Social Connections:   . Frequency of Communication with Friends and Family:   . Frequency of Social Gatherings with Friends and Family:   . Attends Religious Services:   . Active Member of Clubs or Organizations:   . Attends Archivist Meetings:   Marland Kitchen Marital Status:      Family History:  The patient's family history includes Coronary artery disease in his father; Diabetes in his father; Heart disease in his mother.   Review of Systems:   Please see the history of present illness.     General:  No chills, fever, night sweats or weight changes.  Positive for fatigue.  Cardiovascular:  No chest pain, dyspnea on exertion, edema, orthopnea, paroxysmal nocturnal dyspnea. Positive for palpitations.  Dermatological: No rash, lesions/masses Respiratory: No cough, dyspnea Urologic: No hematuria, dysuria Abdominal:   No nausea, vomiting, diarrhea, bright red blood per rectum, melena, or hematemesis Neurologic:  No visual changes, wkns, changes in mental status. All other systems reviewed and are otherwise negative except as noted above.   Physical Exam:    VS:  BP (!) 148/86   Pulse (!) 43   Temp 98.3 F (36.8 C)  Ht 6\' 3"  (1.905 m)   Wt 287 lb (130.2 kg)   SpO2 95%   BMI 35.87 kg/m    General: Well developed, well nourished,male appearing in no acute distress. Head: Normocephalic, atraumatic, sclera non-icteric.  Neck: No carotid bruits. JVD not elevated.  Lungs: Respirations regular and unlabored, without wheezes or rales.  Heart: Regular rhythm, bradycardiac rate. No S3 or S4.  No murmur, no rubs, or gallops appreciated. Abdomen: Soft, non-tender, non-distended. No obvious abdominal masses. Msk:  Strength and tone appear normal for age. No obvious joint deformities or effusions. Extremities: No clubbing or cyanosis. No lower extremit edema.  Distal pedal pulses are 2+ bilaterally. Neuro: Alert and oriented X 3. Moves all extremities spontaneously. No focal deficits noted. Psych:  Responds to questions appropriately with a normal affect. Skin: No rashes or lesions noted  Wt Readings from Last 3 Encounters:  06/16/19 287 lb (130.2 kg)  10/23/17 297 lb 9.6 oz (135 kg)  09/23/17 290 lb (131.5 kg)     Studies/Labs Reviewed:   EKG:  EKG is ordered today.  The ekg ordered today demonstrates sinus bradycardia, HR 45 with no acute ST abnormalities.   Recent Labs: No results found for requested labs within last 8760 hours.   Lipid Panel    Component Value Date/Time   CHOL 196 08/27/2017 0914   TRIG 143 08/27/2017 0914    HDL 33 (L) 08/27/2017 0914   CHOLHDL 5.9 08/27/2017 0914   VLDL 29 08/27/2017 0914   LDLCALC 134 (H) 08/27/2017 0914    Additional studies/ records that were reviewed today include:   Cardiac Catheterization: 09/2017  Prox LAD lesion is 10% stenosed.  Mid Cx lesion is 10% stenosed.  LV end diastolic pressure is mildly elevated.  The left ventricular ejection fraction is 55-65% by visual estimate.    Nonobstructive CAD.  Continue preventive therapy.  Assessment:    1. Coronary artery disease involving native coronary artery of native heart without angina pectoris   2. Bradycardia   3. Palpitations   4. Essential hypertension   5. Hyperlipidemia LDL goal <70   6. OSA (obstructive sleep apnea)      Plan:   In order of problems listed above:  1. CAD - he had nonobstructive CAD by catheterization in 09/2017 as outlined above. He denies any exertional chest pain. Does report some dyspnea on exertion which is likely multifactorial in the setting of deconditioning along with bradycardia. - Will continue ASA 81 mg daily and Quinapril 20 mg daily. Will recheck FLP. If LDL above goal, would recommend initiation of statin therapy.   2. Bradycardia/Palpitations - Heart rate is in the 40's during today's visit by examination and confirmed with EKG. No evidence of high-grade block. He does report some dizziness and worsening fatigue. Given the symptoms along with his bradycardia, will stop Cardizem CD 120 mg daily. He does have a history of palpitations but mostly notices this when checking his radial pulse. If palpitations did progress with discontinuation of Cardizem, would consider an event monitor to rule out any significant arrhythmias and based off results could consider the use of as needed Lopressor. Will check CBC, BMET, TSH and Mg given no recent labs per the patient's report.   3. HTN - BP is elevated at 148/86 during today's visit and he reports SBP has been in the 150's to  180's at times when checked at home.  - will continue Quinapril 20mg  daily but stop Cardizem 120mg  daily given bradycardia and  switch to Amlodipine 5mg  daily. He was provided with a BP log and asked to return this or call with readings in a few weeks. If BP remains above goal, would titrate Amlodipine to 10mg  daily.   4. HLD - Previous FLP in 08/2017 showed total cholesterol 196, triglycerides 143, HDL 33 and LDL 134. His goal LDL is less than 70 in the setting of documented CAD. Will recheck a FLP.   5. OSA - he reports good compliance with his CPAP. If BP remains elevated despite adjustments, would consider a repeat titration study as his last study was 15+ years ago.    Medication Adjustments/Labs and Tests Ordered: Current medicines are reviewed at length with the patient today.  Concerns regarding medicines are outlined above.  Medication changes, Labs and Tests ordered today are listed in the Patient Instructions below. Patient Instructions  Medication Instructions:  Your physician has recommended you make the following change in your medication:  Stop Taking Cardizem CD  Start Taking Amlodipine 5 mg Daily   *If you need a refill on your cardiac medications before your next appointment, please call your pharmacy*   Lab Work: Your physician recommends that you return for lab work in: Fasting   If you have labs (blood work) drawn today and your tests are completely normal, you will receive your results only by: MyChart Message (if you have MyChart) OR . A paper copy in the mail If you have any lab test that is abnormal or we need to change your treatment, we will call you to review the results.   Testing/Procedures: NONE    Follow-Up: At [facility], you and your health needs are our priority.  As part of our continuing mission to provide you with exceptional heart care, we have created designated Provider Care Teams.  These Care Teams include your primary Cardiologist  (physician) and Advanced Practice Providers (APPs -  Physician Assistants and Nurse Practitioners) who all work together to provide you with the care you need, when you need it.  We recommend signing up for the patient portal called "MyChart".  Sign up information is provided on this After Visit Summary.  MyChart is used to connect with patients for Virtual Visits (Telemedicine).  Patients are able to view lab/test results, encounter notes, upcoming appointments, etc.  Non-urgent messages can be sent to your provider as well.   To learn more about what you can do with MyChart, go to Marland Kitchen.    Your next appointment:   1 year(s)  The format for your next appointment:   In Person  Provider:   [facility], MD   Other Instructions Thank you for choosing [facility]!     Signed, [email or web address], PA-C  06/16/2019 5:25 PM    [facility] [address] [place], [address] [place] Phone: [phone] Fax: [phone]

## 2019-06-16 ENCOUNTER — Encounter: Payer: Self-pay | Admitting: Student

## 2019-06-16 ENCOUNTER — Ambulatory Visit (INDEPENDENT_AMBULATORY_CARE_PROVIDER_SITE_OTHER): Payer: 59 | Admitting: Student

## 2019-06-16 ENCOUNTER — Other Ambulatory Visit: Payer: Self-pay

## 2019-06-16 VITALS — BP 148/86 | HR 43 | Temp 98.3°F | Ht 75.0 in | Wt 287.0 lb

## 2019-06-16 DIAGNOSIS — I10 Essential (primary) hypertension: Secondary | ICD-10-CM

## 2019-06-16 DIAGNOSIS — R002 Palpitations: Secondary | ICD-10-CM | POA: Diagnosis not present

## 2019-06-16 DIAGNOSIS — R001 Bradycardia, unspecified: Secondary | ICD-10-CM

## 2019-06-16 DIAGNOSIS — I2510 Atherosclerotic heart disease of native coronary artery without angina pectoris: Secondary | ICD-10-CM

## 2019-06-16 DIAGNOSIS — E785 Hyperlipidemia, unspecified: Secondary | ICD-10-CM

## 2019-06-16 DIAGNOSIS — G4733 Obstructive sleep apnea (adult) (pediatric): Secondary | ICD-10-CM

## 2019-06-16 MED ORDER — AMLODIPINE BESYLATE 5 MG PO TABS: 5.0000 mg | ORAL_TABLET | Freq: Every day | ORAL | 3 refills | Status: DC

## 2019-06-16 NOTE — Patient Instructions (Signed)
Medication Instructions:  Your physician has recommended you make the following change in your medication:  Stop Taking Cardizem CD  Start Taking Amlodipine 5 mg Daily   *If you need a refill on your cardiac medications before your next appointment, please call your pharmacy*   Lab Work: Your physician recommends that you return for lab work in: Fasting   If you have labs (blood work) drawn today and your tests are completely normal, you will receive your results only by: Marland Kitchen MyChart Message (if you have MyChart) OR . A paper copy in the mail If you have any lab test that is abnormal or we need to change your treatment, we will call you to review the results.   Testing/Procedures: NONE    Follow-Up: At [facility], you and your health needs are our priority.  As part of our continuing mission to provide you with exceptional heart care, we have created designated Provider Care Teams.  These Care Teams include your primary Cardiologist (physician) and Advanced Practice Providers (APPs -  Physician Assistants and Nurse Practitioners) who all work together to provide you with the care you need, when you need it.  We recommend signing up for the patient portal called "MyChart".  Sign up information is provided on this After Visit Summary.  MyChart is used to connect with patients for Virtual Visits (Telemedicine).  Patients are able to view lab/test results, encounter notes, upcoming appointments, etc.  Non-urgent messages can be sent to your provider as well.   To learn more about what you can do with MyChart, go to [email or web address].    Your next appointment:   1 year(s)  The format for your next appointment:   In Person  Provider:   Nona Dell, MD   Other Instructions Thank you for choosing [facility]!

## 2019-06-23 ENCOUNTER — Telehealth: Payer: Self-pay

## 2019-06-23 ENCOUNTER — Other Ambulatory Visit (HOSPITAL_COMMUNITY)
Admission: RE | Admit: 2019-06-23 | Discharge: 2019-06-23 | Disposition: A | Discharge status: Home / Self Care | Payer: 59 | Source: Ambulatory Visit | Attending: Student | Admitting: Student

## 2019-06-23 ENCOUNTER — Other Ambulatory Visit: Payer: Self-pay

## 2019-06-23 DIAGNOSIS — R002 Palpitations: Secondary | ICD-10-CM | POA: Diagnosis present

## 2019-06-23 DIAGNOSIS — I10 Essential (primary) hypertension: Secondary | ICD-10-CM | POA: Insufficient documentation

## 2019-06-23 DIAGNOSIS — E785 Hyperlipidemia, unspecified: Secondary | ICD-10-CM | POA: Insufficient documentation

## 2019-06-23 DIAGNOSIS — M7061 Trochanteric bursitis, right hip: Secondary | ICD-10-CM | POA: Insufficient documentation

## 2019-06-23 LAB — LIPID PANEL
Cholesterol: 233 mg/dL — ABNORMAL HIGH (ref 0–200)
HDL: 41 mg/dL (ref >40)
LDL Cholesterol: 163 mg/dL — ABNORMAL HIGH (ref 0–99)
Total CHOL/HDL Ratio: 5.7 RATIO
Triglycerides: 145 mg/dL (ref <150)
VLDL: 29 mg/dL (ref 0–40)

## 2019-06-23 LAB — CBC
HCT: 48.8 % (ref 39.0–52.0)
Hemoglobin: 16.6 g/dL (ref 13.0–17.0)
MCH: 29.9 pg (ref 26.0–34.0)
MCHC: 34 g/dL (ref 30.0–36.0)
MCV: 87.9 fL (ref 80.0–100.0)
Platelets: 211 10*3/uL (ref 150–400)
RBC: 5.55 MIL/uL (ref 4.22–5.81)
RDW: 13.2 % (ref 11.5–15.5)
WBC: 6.2 10*3/uL (ref 4.0–10.5)
nRBC: 0 % (ref 0.0–0.2)

## 2019-06-23 LAB — BASIC METABOLIC PANEL
Anion gap: 10 (ref 5–15)
BUN: 14 mg/dL (ref 6–20)
CO2: 25 mmol/L (ref 22–32)
Calcium: 9.4 mg/dL (ref 8.9–10.3)
Chloride: 101 mmol/L (ref 98–111)
Creatinine, Ser: 1.08 mg/dL (ref 0.61–1.24)
GFR calc Af Amer: >60 mL/min (ref >60)
GFR calc non Af Amer: >60 mL/min (ref >60)
Glucose, Bld: 99 mg/dL (ref 70–99)
Potassium: 4.4 mmol/L (ref 3.5–5.1)
Sodium: 136 mmol/L (ref 135–145)

## 2019-06-23 LAB — MAGNESIUM: Magnesium: 2.3 mg/dL (ref 1.7–2.4)

## 2019-06-23 LAB — TSH: TSH: 3.073 u[IU]/mL (ref 0.350–4.500)

## 2019-06-23 MED ORDER — ATORVASTATIN CALCIUM 40 MG PO TABS
40.0000 mg | ORAL_TABLET | Freq: Every day | ORAL | 3 refills | Status: DC
Start: 2019-06-23 — End: 2019-12-29

## 2019-06-23 NOTE — Telephone Encounter (Signed)
Patient returned call

## 2019-06-23 NOTE — Telephone Encounter (Signed)
Pt made aware of his results and the advisement to start Lipitor. Pt unable to make his f/u lab appt for 3 months at this time because he is out to dinner with his family. FLP and LFT lab order placed. 40 mg Lipitor once daily RX sent.

## 2019-06-23 NOTE — Telephone Encounter (Signed)
-----   Message from Dyann Kief, PA-C sent at 06/23/2019 12:54 PM EDT ----- All labs normal except LDL cholesterol 163 and goal is less than 70. Begin lipitor 40 mg once daily and f/u FLP and LFT's in 3 months. Covering Brittany's box today. thanks

## 2019-06-23 NOTE — Telephone Encounter (Signed)
LMTCB

## 2019-07-06 ENCOUNTER — Telehealth: Payer: Self-pay | Admitting: Student

## 2019-07-06 NOTE — Telephone Encounter (Signed)
Patient states after eating meals, he has palpitations and extreme fatigue for 30 minutes. This occurs with all meals ... light or heavy meal. This has occurred for the past week.Took his BP during an episode and had 155/76, HR 57 reading. He states this happened 2 years ago and went to the ED and had an EKG and was told it was normal.it went away after words and returned with same sx's last week Denies reflux.

## 2019-07-06 NOTE — Telephone Encounter (Signed)
Please give pt a call-- stated since he's started the amLODipine (NORVASC) 5 MG tablet [[id number]]  That's he maybe having a reaction to it.   Please call [phone]

## 2019-07-06 NOTE — Telephone Encounter (Signed)
Patient just got to work.He  Is not sure if he wants to wear a monitor again. He wants to think about his options and call me back tomorroe.

## 2019-07-06 NOTE — Telephone Encounter (Signed)
° ° °  His heart rate was in the 40's at the time of his last visit, that is why we stopped Cardizem CD and this could explain why his palpitations have increased but it is odd they occur with food consumption. I do not think Amlodipine is necessarily causing his symptoms but it could be the discontinuation of Cardizem. I would recommend that he try taking Amlodipine in the evening hours to see if this helps.  In regards to his palpitations, options would be he can wear a 2-week monitor to assess for any significant arrhythmias. If he does not wish to wear a monitor, we could try adding Lopressor 12.5 mg twice daily to see if this helps but he would need to follow HR and BP closely given his bradycardia. I think a monitor would be the best option given his intermittent symptoms and bradycardia.   Thanks,  [place]

## 2019-07-07 ENCOUNTER — Ambulatory Visit (INDEPENDENT_AMBULATORY_CARE_PROVIDER_SITE_OTHER): Payer: 59

## 2019-07-07 DIAGNOSIS — R001 Bradycardia, unspecified: Secondary | ICD-10-CM

## 2019-07-07 NOTE — Telephone Encounter (Signed)
Pt called back, please return call  

## 2019-07-07 NOTE — Telephone Encounter (Signed)
Patient will come in today for Zio XT 14 day monitor for palps.

## 2019-07-13 ENCOUNTER — Telehealth: Payer: Self-pay | Admitting: Cardiology

## 2019-07-13 NOTE — Telephone Encounter (Signed)
     I would recommend he consider an [employer] monitor which can connect to his phone and record strips which he can send to [place] for review if questions. These are not covered by insurance so he does not need an Rx for one. Different monitors are available but typically range between $80 - $150.   Signed, Ellsworth Lennox, PA-C 07/13/2019, 4:26 PM Pager: [phone]

## 2019-07-13 NOTE — Telephone Encounter (Signed)
Pt would like someone to please give him a call concerning a monitor

## 2019-07-13 NOTE — Telephone Encounter (Signed)
Pt thankful for the information. States that he will look into getting one.

## 2019-07-13 NOTE — Telephone Encounter (Signed)
Spoke with pt who states he has seen heart monitors advertised on TV and in the Internet. He states that after wearing the current monitor he would like to have a Rx for that a monitor. Pt states he would like monitor just in case he has episodes in the future. Please advise.

## 2019-07-15 ENCOUNTER — Telehealth: Payer: Self-pay

## 2019-07-15 NOTE — Telephone Encounter (Signed)
Monitor worn for 8 days( of 14)  and then patient was unable to tolerate due to itching.he will mail back to company

## 2019-07-15 NOTE — Telephone Encounter (Signed)
Pt states monitor-patch is causing a terrible itch. He wants to take the monitor off.   Please call [phone]   Thanks renee

## 2019-07-21 ENCOUNTER — Telehealth: Payer: Self-pay | Admitting: Student

## 2019-07-21 MED ORDER — METOPROLOL TARTRATE 25 MG PO TABS
12.5000 mg | ORAL_TABLET | Freq: Two times a day (BID) | ORAL | 6 refills | Status: DC | PRN
Start: 2019-07-21 — End: 2019-08-11

## 2019-07-21 NOTE — Telephone Encounter (Signed)
Pt reports that he has been having episodes of palpitations where he has chest pressure, SOB and left arm tingling. Pt states that the episodes became worse on Sunday and today. Denies nausea at this time. Pt states he mail his monitor back on Friday 07/16/19. Please advise

## 2019-07-21 NOTE — Telephone Encounter (Signed)
    Thank you. Continue with recommendations as outlined below.   Signed, Ellsworth Lennox, PA-C 07/21/2019, 5:08 PM Pager: [phone]

## 2019-07-21 NOTE — Addendum Note (Signed)
Addended by: Kerney Elbe on: 07/21/2019 05:32 PM   Modules accepted: Orders

## 2019-07-21 NOTE — Telephone Encounter (Addendum)
     Has he been able to check his HR and BP today? Recently had significant bradycardia with Cardizem CD and had previously recommended by prior phone note in 07/06/2019 he could try as needed Lopressor 12.5 mg up to twice daily for palpitations but would not take if HR less than 50 bpm.  If he is having active chest pain and dyspnea, he needs to proceed to the ED for evaluation. He had nonobstructive diease by catheterization in 09/2017 but if he is experiencing new symptoms, we would need to rule out an acute plaque rupture.  Signed, Ellsworth Lennox, PA-C 07/21/2019, 3:05 PM Pager: [phone]

## 2019-07-21 NOTE — Telephone Encounter (Signed)
Current BP today is 143/77 with HR of 59.

## 2019-07-21 NOTE — Telephone Encounter (Signed)
Left arm tingling, not pain. Chest pressure like someone sitting on it but bearable. SOB a little bit, not dramatically. Heart skipping, discussed this before. Pulse 38 last night around 10pm, usually runs 60-62 on average.

## 2019-07-30 ENCOUNTER — Telehealth: Payer: Self-pay | Admitting: Student

## 2019-07-30 NOTE — Telephone Encounter (Signed)
Zio processing data 07/21/19, pt made aware

## 2019-07-30 NOTE — Telephone Encounter (Signed)
Pt. Is requesting a call back about his heart monitor results. He says that it has been 2 weeks and he has not heard anything about his results

## 2019-08-03 NOTE — H&P (Signed)
Surgical History & Physical  Patient Name: [name] DOB: 27-Jul-1960  Surgery: Cataract extraction with intraocular lens implant phacoemulsification; Right Eye  Surgeon: Fabio Pierce MD Surgery Date:  08/16/2019 Pre-Op Date:  08/03/2019  HPI: A 48 Yr. old male patient 1. 1. The patient complains of difficulty when driving, which began 1 years ago. The right eye is affected. The episode is constant and gradual. The patient describes foggy, ghosting, glare and hazy symptoms affecting their eyes/vision. The condition's severity increased since last visit and is severe and worsening. Symptoms occur when the patient is driving. The condition is worse outdoors. Significant glare with lights day and night. Not comfortable driving at night. Work has become very difficult. This is negatively affecting the patient's quality of life. HPI Completed by Dr. Fabio Pierce  Medical History: Cataracts Arthritis Depression Back problems Heart Problem High Blood Pressure LDL  Review of Systems Musculoskeletal Joint Ache, Pain All recorded systems are negative except as noted above.  Social   Never smoked   Medication Aspirin, Oral escitalopram, Quinapril, Gabapentin, Meloxicam, Lipitor, Amlodipine Besylate,   Sx/Procedures 2 back surgeries,   Drug Allergies  Dye, Latex,   History & Physical: Heent:  Cataract, Right eye NECK: supple without bruits LUNGS: lungs clear to auscultation CV: regular rate and rhythm Abdomen: soft and non-tender  Impression & Plan: Assessment: 1.  COMBINED FORMS AGE RELATED CATARACT; Both Eyes (H25.813)  Plan: 1.  Cataract accounts for the patient's decreased vision. This visual impairment is not correctable with a tolerable change in glasses or contact lenses. Cataract surgery with an implantation of a new lens should significantly improve the visual and functional status of the patient. Discussed all risks, benefits, alternatives, and potential  complications. Discussed the procedures and recovery. Patient desires to have surgery. A-scan ordered and performed today for intra-ocular lens calculations. The surgery will be performed in order to improve vision for driving, reading, and for eye examinations. Recommend phacoemulsification with intra-ocular lens. Right Eye worse - first. Dilates poorly - shugacaine by protocol. Malyugin Ring in room. [place].

## 2019-08-11 ENCOUNTER — Telehealth: Payer: Self-pay | Admitting: Student

## 2019-08-11 ENCOUNTER — Telehealth: Payer: Self-pay

## 2019-08-11 ENCOUNTER — Other Ambulatory Visit: Payer: Self-pay

## 2019-08-11 DIAGNOSIS — R001 Bradycardia, unspecified: Secondary | ICD-10-CM

## 2019-08-11 MED ORDER — METOPROLOL TARTRATE 25 MG PO TABS
12.5000 mg | ORAL_TABLET | Freq: Two times a day (BID) | ORAL | 3 refills | Status: DC
Start: 2019-08-11 — End: 2020-11-15

## 2019-08-11 NOTE — Telephone Encounter (Signed)
Monitor in MD in basket to be read.I told patient I would call him later today.

## 2019-08-11 NOTE — Telephone Encounter (Signed)
-----   Message from Ellsworth Lennox, [place] sent at 08/11/2019  1:02 PM EDT ----- Please let the patient know his event monitor showed normal sinus rhythm with occasional premature beats including PAC's and PVC's.  His symptoms typically correlated with PVC's. He did have very brief episodes of supraventricular tachycardia with the longest episode was 7 beats. He did not any significant arrhythmias or pauses.  In regards to his PVCs, I would recommend limiting caffeine and alcohol intake. Would continue with Lopressor 12.5 mg twice daily which he can take regularly or as needed if still having frequent palpitations.

## 2019-08-11 NOTE — Patient Instructions (Signed)
[name]  08/11/2019     @PREFPERIOPPHARMACY @   Your procedure is scheduled on  08/16/2019 .  Report to 10/17/2019 at  0730  A.M.  Call this number if you have problems the morning of surgery:  [phone]   Remember:  Do not eat or drink after midnight.                         Take these medicines the morning of surgery with A SIP OF WATER  Amlodipine, lexapro, mobic(if needed), metoprolol.    Do not wear jewelry, make-up or nail polish.  Do not wear lotions, powders, or perfumes. Please wear deodorant and brush your teeth.  Do not shave 48 hours prior to surgery.  Men may shave face and neck.  Do not bring valuables to the hospital.  [facility] is not responsible for any belongings or valuables.  Contacts, dentures or bridgework may not be worn into surgery.  Leave your suitcase in the car.  After surgery it may be brought to your room.  For patients admitted to the hospital, discharge time will be determined by your treatment team.  Patients discharged the day of surgery will not be allowed to drive home.   Name and phone number of your driver:   family Special instructions:  DO NOT smoke the morning of your procedure.  Please read over the following fact sheets that you were given. Anesthesia Post-op Instructions and Care and Recovery After Surgery       Cataract Surgery, Care After This sheet gives you information about how to care for yourself after your procedure. Your health care provider may also give you more specific instructions. If you have problems or questions, contact your health care provider. What can I expect after the procedure? After the procedure, it is common to have:  Itching.  Discomfort.  Fluid discharge.  Sensitivity to light and to touch.  Bruising in or around the eye.  Mild blurred vision. Follow these instructions at home: Eye care   Do not touch or rub your eyes.  Protect your eyes as told by your health  care provider. You may be told to wear a protective eye shield or sunglasses.  Do not put a contact lens into the affected eye or eyes until your health care provider approves.  Keep the area around your eye clean and dry: ? Avoid swimming. ? Do not allow water to hit you directly in the face while showering. ? Keep soap and shampoo out of your eyes.  Check your eye every day for signs of infection. Watch for: ? Redness, swelling, or pain. ? Fluid, blood, or pus. ? Warmth. ? A bad smell. ? Vision that is getting worse. ? Sensitivity that is getting worse. Activity  Do not drive for 24 hours if you were given a sedative during your procedure.  Avoid strenuous activities, such as playing contact sports, for as long as told by your health care provider.  Do not drive or use heavy machinery until your health care provider approves.  Do not bend or lift heavy objects. Bending increases pressure in the eye. You can walk, climb stairs, and do light household chores.  Ask your health care provider when you can return to work. If you work in a dusty environment, you may be advised to wear protective eyewear for a period of time. General instructions  Take or apply over-the-counter  and prescription medicines only as told by your health care provider. This includes eye drops.  Keep all follow-up visits as told by your health care provider. This is important. Contact a health care provider if:  You have increased bruising around your eye.  You have pain that is not helped with medicine.  You have a fever.  You have redness, swelling, or pain in your eye.  You have fluid, blood, or pus coming from your incision.  Your vision gets worse.  Your sensitivity to light gets worse. Get help right away if:  You have sudden loss of vision.  You see flashes of light or spots (floaters).  You have severe eye pain.  You develop nausea or vomiting. Summary  After your procedure, it is  common to have itching, discomfort, bruising, fluid discharge, or sensitivity to light.  Follow instructions from your health care provider about caring for your eye after the procedure.  Do not rub your eye after the procedure. You may need to wear eye protection or sunglasses. Do not wear contact lenses. Keep the area around your eye clean and dry.  Avoid activities that require a lot of effort. These include playing sports and lifting heavy objects.  Contact a health care provider if you have increased bruising, pain that does not go away, or a fever. Get help right away if you suddenly lose your vision, see flashes of light or spots, or have severe pain in the eye. This information is not intended to replace advice given to you by your health care provider. Make sure you discuss any questions you have with your health care provider. Document Revised: 11/17/2018 Document Reviewed: 07/21/2017 Elsevier Patient Education  2020 [place] After These instructions provide you with information about caring for yourself after your procedure. Your health care provider may also give you more specific instructions. Your treatment has been planned according to current medical practices, but problems sometimes occur. Call your health care provider if you have any problems or questions after your procedure. What can I expect after the procedure? After your procedure, you may:  Feel sleepy for several hours.  Feel clumsy and have poor balance for several hours.  Feel forgetful about what happened after the procedure.  Have poor judgment for several hours.  Feel nauseous or vomit.  Have a sore throat if you had a breathing tube during the procedure. Follow these instructions at home: For at least 24 hours after the procedure:      Have a responsible adult stay with you. It is important to have someone help care for you until you are awake and alert.  Rest  as needed.  Do not: ? Participate in activities in which you could fall or become injured. ? Drive. ? Use heavy machinery. ? Drink alcohol. ? Take sleeping pills or medicines that cause drowsiness. ? Make important decisions or sign legal documents. ? Take care of children on your own. Eating and drinking  Follow the diet that is recommended by your health care provider.  If you vomit, drink water, juice, or soup when you can drink without vomiting.  Make sure you have little or no nausea before eating solid foods. General instructions  Take over-the-counter and prescription medicines only as told by your health care provider.  If you have sleep apnea, surgery and certain medicines can increase your risk for breathing problems. Follow instructions from your health care provider about wearing your sleep device: ?  Anytime you are sleeping, including during daytime naps. ? While taking prescription pain medicines, sleeping medicines, or medicines that make you drowsy.  If you smoke, do not smoke without supervision.  Keep all follow-up visits as told by your health care provider. This is important. Contact a health care provider if:  You keep feeling nauseous or you keep vomiting.  You feel light-headed.  You develop a rash.  You have a fever. Get help right away if:  You have trouble breathing. Summary  For several hours after your procedure, you may feel sleepy and have poor judgment.  Have a responsible adult stay with you for at least 24 hours or until you are awake and alert. This information is not intended to replace advice given to you by your health care provider. Make sure you discuss any questions you have with your health care provider. Document Revised: 04/21/2017 Document Reviewed: 05/14/2015 Elsevier Patient Education  [place].

## 2019-08-11 NOTE — Telephone Encounter (Signed)
Patient notified of event monitor results. He wants to take lopressor from PRN to BID, e-scribed [facility]    Pt requested lab slips to be mailed for FLP and LFT's

## 2019-08-11 NOTE — Telephone Encounter (Signed)
Pt states he mailed monitor back in more than 3 weeks ago. Please with results..   Please call [phone]   Thanks renee

## 2019-08-12 ENCOUNTER — Encounter (HOSPITAL_COMMUNITY)
Admission: RE | Admit: 2019-08-12 | Discharge: 2019-08-12 | Disposition: A | Discharge status: Home / Self Care | Payer: 59 | Source: Ambulatory Visit | Attending: Ophthalmology | Admitting: Ophthalmology

## 2019-08-12 ENCOUNTER — Other Ambulatory Visit: Payer: Self-pay

## 2019-08-12 ENCOUNTER — Other Ambulatory Visit (HOSPITAL_COMMUNITY): Admission: RE | Admit: 2019-08-12 | Payer: 59 | Source: Ambulatory Visit

## 2019-08-16 ENCOUNTER — Ambulatory Visit (HOSPITAL_COMMUNITY): Payer: 59 | Admitting: Anesthesiology

## 2019-08-16 ENCOUNTER — Encounter (HOSPITAL_COMMUNITY)
Admission: RE | Disposition: A | Discharge status: Home / Self Care | Payer: Self-pay | Source: Home / Self Care | Attending: Ophthalmology

## 2019-08-16 ENCOUNTER — Encounter (HOSPITAL_COMMUNITY): Payer: Self-pay | Admitting: Ophthalmology

## 2019-08-16 ENCOUNTER — Ambulatory Visit (HOSPITAL_COMMUNITY)
Admission: RE | Admit: 2019-08-16 | Discharge: 2019-08-16 | Disposition: A | Discharge status: Home / Self Care | Payer: 59 | Attending: Ophthalmology | Admitting: Ophthalmology

## 2019-08-16 DIAGNOSIS — Z91041 Radiographic dye allergy status: Secondary | ICD-10-CM | POA: Diagnosis not present

## 2019-08-16 DIAGNOSIS — G709 Myoneural disorder, unspecified: Secondary | ICD-10-CM | POA: Diagnosis not present

## 2019-08-16 DIAGNOSIS — Z91040 Latex allergy status: Secondary | ICD-10-CM | POA: Insufficient documentation

## 2019-08-16 DIAGNOSIS — F329 Major depressive disorder, single episode, unspecified: Secondary | ICD-10-CM | POA: Insufficient documentation

## 2019-08-16 DIAGNOSIS — Z79899 Other long term (current) drug therapy: Secondary | ICD-10-CM | POA: Insufficient documentation

## 2019-08-16 DIAGNOSIS — M1990 Unspecified osteoarthritis, unspecified site: Secondary | ICD-10-CM | POA: Insufficient documentation

## 2019-08-16 DIAGNOSIS — K219 Gastro-esophageal reflux disease without esophagitis: Secondary | ICD-10-CM | POA: Diagnosis not present

## 2019-08-16 DIAGNOSIS — G4730 Sleep apnea, unspecified: Secondary | ICD-10-CM | POA: Diagnosis not present

## 2019-08-16 DIAGNOSIS — H25811 Combined forms of age-related cataract, right eye: Secondary | ICD-10-CM | POA: Diagnosis present

## 2019-08-16 DIAGNOSIS — I10 Essential (primary) hypertension: Secondary | ICD-10-CM | POA: Diagnosis not present

## 2019-08-16 HISTORY — PX: CATARACT EXTRACTION W/PHACO: SHX586

## 2019-08-16 SURGERY — PHACOEMULSIFICATION, CATARACT, WITH IOL INSERTION
Anesthesia: Monitor Anesthesia Care | Site: Eye | Laterality: Right

## 2019-08-16 MED ORDER — MIDAZOLAM HCL 2 MG/2ML IJ SOLN
INTRAMUSCULAR | Status: AC
  Filled 2019-08-16: qty 2

## 2019-08-16 MED ORDER — BSS IO SOLN
INTRAOCULAR | Status: DC | PRN
  Administered 2019-08-16: 15 mL via INTRAOCULAR

## 2019-08-16 MED ORDER — LIDOCAINE 3.5 % OP GEL OPTIME - NO CHARGE
OPHTHALMIC | Status: DC | PRN
  Administered 2019-08-16: 1 [drp] via OPHTHALMIC

## 2019-08-16 MED ORDER — NEOMYCIN-POLYMYXIN-DEXAMETH 3.5-10000-0.1 OP SUSP
OPHTHALMIC | Status: DC | PRN
  Administered 2019-08-16: 1 [drp] via OPHTHALMIC

## 2019-08-16 MED ORDER — PROVISC 10 MG/ML IO SOLN
INTRAOCULAR | Status: DC | PRN
  Administered 2019-08-16: 0.85 mL via INTRAOCULAR

## 2019-08-16 MED ORDER — PHENYLEPHRINE-KETOROLAC 1-0.3 % IO SOLN
INTRAOCULAR | Status: AC
  Filled 2019-08-16: qty 4

## 2019-08-16 MED ORDER — ONDANSETRON HCL 4 MG/2ML IJ SOLN
INTRAMUSCULAR | Status: AC
  Filled 2019-08-16: qty 2

## 2019-08-16 MED ORDER — SODIUM HYALURONATE 23 MG/ML IO SOLN
INTRAOCULAR | Status: DC | PRN
  Administered 2019-08-16: 0.6 mL via INTRAOCULAR

## 2019-08-16 MED ORDER — LIDOCAINE HCL (PF) 1 % IJ SOLN
INTRAOCULAR | Status: DC | PRN
  Administered 2019-08-16: 1 mL via OPHTHALMIC

## 2019-08-16 MED ORDER — EPINEPHRINE PF 1 MG/ML IJ SOLN
INTRAMUSCULAR | Status: AC
  Filled 2019-08-16: qty 1

## 2019-08-16 MED ORDER — PHENYLEPHRINE HCL 2.5 % OP SOLN
1.0000 [drp] | OPHTHALMIC | Status: AC | PRN
  Administered 2019-08-16 (×3): 1 [drp] via OPHTHALMIC

## 2019-08-16 MED ORDER — PHENYLEPHRINE-KETOROLAC 1-0.3 % IO SOLN
INTRAOCULAR | Status: DC | PRN
  Administered 2019-08-16: 500 mL via OPHTHALMIC

## 2019-08-16 MED ORDER — LIDOCAINE HCL 3.5 % OP GEL
1.0000 "application " | Freq: Once | OPHTHALMIC | Status: AC
  Administered 2019-08-16: 1 via OPHTHALMIC

## 2019-08-16 MED ORDER — POVIDONE-IODINE 5 % OP SOLN
OPHTHALMIC | Status: DC | PRN
  Administered 2019-08-16: 1 via OPHTHALMIC

## 2019-08-16 MED ORDER — ONDANSETRON HCL 4 MG/2ML IJ SOLN
INTRAMUSCULAR | Status: DC | PRN
  Administered 2019-08-16: 4 mg via INTRAVENOUS

## 2019-08-16 MED ORDER — CYCLOPENTOLATE-PHENYLEPHRINE 0.2-1 % OP SOLN
1.0000 [drp] | OPHTHALMIC | Status: AC | PRN
  Administered 2019-08-16 (×3): 1 [drp] via OPHTHALMIC

## 2019-08-16 MED ORDER — MIDAZOLAM HCL 2 MG/2ML IJ SOLN
INTRAMUSCULAR | Status: DC | PRN
  Administered 2019-08-16 (×2): 1 mg via INTRAVENOUS

## 2019-08-16 MED ORDER — TETRACAINE HCL 0.5 % OP SOLN
1.0000 [drp] | OPHTHALMIC | Status: AC | PRN
  Administered 2019-08-16 (×3): 1 [drp] via OPHTHALMIC

## 2019-08-16 SURGICAL SUPPLY — 17 items
CLOTH BEACON ORANGE TIMEOUT ST (SAFETY) ×2 IMPLANT
DEVICE MILOOP (MISCELLANEOUS) IMPLANT
EYE SHIELD UNIVERSAL CLEAR (GAUZE/BANDAGES/DRESSINGS) ×2 IMPLANT
GLOVE BIOGEL PI IND STRL 7.0 (GLOVE) IMPLANT
GLOVE BIOGEL PI INDICATOR 7.0 (GLOVE) ×4
LENS ALC ACRYL/TECN (Ophthalmic Related) ×2 IMPLANT
MILOOP DEVICE (MISCELLANEOUS)
NDL HYPO 18GX1.5 BLUNT FILL (NEEDLE) IMPLANT
NEEDLE HYPO 18GX1.5 BLUNT FILL (NEEDLE) ×3 IMPLANT
PAD ARMBOARD 7.5X6 YLW CONV (MISCELLANEOUS) ×2 IMPLANT
RING MALYGIN (MISCELLANEOUS) IMPLANT
RING MALYGIN 7.0 (MISCELLANEOUS) IMPLANT
SYR TB 1ML LL NO SAFETY (SYRINGE) ×2 IMPLANT
TAPE SURG TRANSPORE 1 IN (GAUZE/BANDAGES/DRESSINGS) IMPLANT
TAPE SURGICAL TRANSPORE 1 IN (GAUZE/BANDAGES/DRESSINGS) ×3
VISCOELASTIC ADDITIONAL (OPHTHALMIC RELATED) ×2 IMPLANT
WATER STERILE IRR 250ML POUR (IV SOLUTION) ×2 IMPLANT

## 2019-08-16 NOTE — Anesthesia Postprocedure Evaluation (Signed)
Anesthesia Post Note  Patient: [name]  Procedure(s) Performed: CATARACT EXTRACTION PHACO AND INTRAOCULAR LENS PLACEMENT RIGHT EYE (Right Eye)  Patient location during evaluation: Phase II Anesthesia Type: MAC Level of consciousness: awake, oriented, awake and alert and patient cooperative Pain management: pain level controlled Vital Signs Assessment: post-procedure vital signs reviewed and stable Respiratory status: spontaneous breathing and respiratory function stable Cardiovascular status: blood pressure returned to baseline and stable Anesthetic complications: no   No complications documented.   Last Vitals:  Vitals:   08/16/19 0845 08/16/19 0850  BP: 118/84   Pulse: (!) 47 (!) 50  Resp: (!) 9 17  Temp: 36.8 C   SpO2: 95% 96%    Last Pain:  Vitals:   08/16/19 0845  PainSc: 0-No pain                 Caroline Matters L

## 2019-08-16 NOTE — Transfer of Care (Signed)
Immediate Anesthesia Transfer of Care Note  Patient: [name]  Procedure(s) Performed: CATARACT EXTRACTION PHACO AND INTRAOCULAR LENS PLACEMENT RIGHT EYE (Right Eye)  Patient Location: PACU  Anesthesia Type:MAC  Level of Consciousness: awake, alert , oriented and patient cooperative  Airway & Oxygen Therapy: Patient Spontanous Breathing  Post-op Assessment: Report given to RN and Post -op Vital signs reviewed and stable  Post vital signs: Reviewed and stable  Last Vitals: see phase 2 PACU VS; VSS Vitals Value Taken Time  BP    Temp    Pulse    Resp    SpO2      Last Pain:  Vitals:   08/16/19 0845  PainSc: 0-No pain         Complications: No complications documented.

## 2019-08-16 NOTE — Discharge Instructions (Signed)
Please discharge patient when stable, will follow up today with Dr. Zoya Sprecher at the [facility] office immediately following discharge.  Leave shield in place until visit.  All paperwork with discharge instructions will be given at the office.  [facility] Address:  [address]  [place], [place] [address]             Monitored Anesthesia Care, Care After These instructions provide you with information about caring for yourself after your procedure. Your health care provider may also give you more specific instructions. Your treatment has been planned according to current medical practices, but problems sometimes occur. Call your health care provider if you have any problems or questions after your procedure. What can I expect after the procedure? After your procedure, you may:  Feel sleepy for several hours.  Feel clumsy and have poor balance for several hours.  Feel forgetful about what happened after the procedure.  Have poor judgment for several hours.  Feel nauseous or vomit.  Have a sore throat if you had a breathing tube during the procedure. Follow these instructions at home: For at least 24 hours after the procedure:      Have a responsible adult stay with you. It is important to have someone help care for you until you are awake and alert.  Rest as needed.  Do not: ? Participate in activities in which you could fall or become injured. ? Drive. ? Use heavy machinery. ? Drink alcohol. ? Take sleeping pills or medicines that cause drowsiness. ? Make important decisions or sign legal documents. ? Take care of children on your own. Eating and drinking  Follow the diet that is recommended by your health care provider.  If you vomit, drink water, juice, or soup when you can drink without vomiting.  Make sure you have little or no nausea before eating solid foods. General instructions  Take over-the-counter and  prescription medicines only as told by your health care provider.  If you have sleep apnea, surgery and certain medicines can increase your risk for breathing problems. Follow instructions from your health care provider about wearing your sleep device: ? Anytime you are sleeping, including during daytime naps. ? While taking prescription pain medicines, sleeping medicines, or medicines that make you drowsy.  If you smoke, do not smoke without supervision.  Keep all follow-up visits as told by your health care provider. This is important. Contact a health care provider if:  You keep feeling nauseous or you keep vomiting.  You feel light-headed.  You develop a rash.  You have a fever. Get help right away if:  You have trouble breathing. Summary  For several hours after your procedure, you may feel sleepy and have poor judgment.  Have a responsible adult stay with you for at least 24 hours or until you are awake and alert. This information is not intended to replace advice given to you by your health care provider. Make sure you discuss any questions you have with your health care provider. Document Revised: 04/21/2017 Document Reviewed: 05/14/2015 Elsevier Patient Education  2020 [employer]  

## 2019-08-16 NOTE — Addendum Note (Signed)
Addendum  created 08/16/19 1444 by Mandisa Persinger L, CRNA   Charge Capture section accepted    

## 2019-08-16 NOTE — Op Note (Signed)
Date of procedure: 08/16/19  Pre-operative diagnosis:  Visually significant combined form age-related cataract, Right Eye (H25.811)  Post-operative diagnosis:  Visually significant combined form age-related cataract, Right Eye (H25.811)  Procedure: Removal of cataract via phacoemulsification and insertion of intra-ocular lens Wynetta Emery and [place]  +15.5D into the capsular bag of the Right Eye  Attending surgeon: Gerda Diss. Sharica Roedel, MD, MA  Anesthesia: MAC, Topical [place]  Complications: None  Estimated Blood Loss: <65m (minimal)  Specimens: None  Implants: As above  Indications:  Visually significant age-related cataract, Right Eye  Procedure:  The patient was seen and identified in the pre-operative area. The operative eye was identified and dilated.  The operative eye was marked.  Topical anesthesia was administered to the operative eye.     The patient was then to the operative suite and placed in the supine position.  A timeout was performed confirming the patient, procedure to be performed, and all other relevant information.   The patient's face was prepped and draped in the usual fashion for intra-ocular surgery.  A lid speculum was placed into the operative eye and the surgical microscope moved into place and focused.  A superotemporal paracentesis was created using a 20 gauge paracentesis blade.  Shugarcaine was injected into the anterior chamber.  Viscoelastic was injected into the anterior chamber.  A temporal clear-corneal main wound incision was created using a 2.491mmicrokeratome.  A continuous curvilinear capsulorrhexis was initiated using an irrigating cystitome and completed using capsulorrhexis forceps.  Hydrodissection and hydrodeliniation were performed.  Viscoelastic was injected into the anterior chamber.  A phacoemulsification handpiece and a chopper as a second instrument were used to remove the nucleus and epinucleus. The irrigation/aspiration handpiece was  used to remove any remaining cortical material.   The capsular bag was reinflated with viscoelastic, checked, and found to be intact.  The intraocular lens was inserted into the capsular bag.  The irrigation/aspiration handpiece was used to remove any remaining viscoelastic.  The clear corneal wound and paracentesis wounds were then hydrated and checked with Weck-Cels to be watertight.  The lid-speculum was removed.  The drape was removed.  The patient's face was cleaned with a wet and dry 4x4.   Maxitrol was instilled in the eye. A clear shield was taped over the eye. The patient was taken to the post-operative care unit in good condition, having tolerated the procedure well.  Post-Op Instructions: [facility] and will receive all other orders and instructions.

## 2019-08-16 NOTE — Interval H&P Note (Signed)
History and Physical Interval Note:  08/16/2019 9:18 AM  [name]  has presented today for surgery, with the diagnosis of Nuclear sclerotic cataract - Right eye.  The various methods of treatment have been discussed with the patient and family. After consideration of risks, benefits and other options for treatment, the patient has consented to  Procedure(s) with comments: CATARACT EXTRACTION PHACO AND INTRAOCULAR LENS PLACEMENT (IOC) (Right) - CDE:  as a surgical intervention.  The patient's history has been reviewed, patient examined, no change in status, stable for surgery.  I have reviewed the patient's chart and labs.  Questions were answered to the patient's satisfaction.     Fabio Pierce

## 2019-08-16 NOTE — Anesthesia Preprocedure Evaluation (Signed)
Anesthesia Evaluation  Patient identified by MRN, date of birth, ID band Patient awake    Reviewed: Allergy & Precautions, H&P , NPO status , Patient's Chart, lab work & pertinent test results, reviewed documented beta blocker date and time   Airway Mallampati: III  TM Distance: >3 FB Neck ROM: full    Dental no notable dental hx.    Pulmonary sleep apnea , pneumonia, resolved,    Pulmonary exam normal breath sounds clear to auscultation       Cardiovascular Exercise Tolerance: Good hypertension, negative cardio ROS   Rhythm:regular Rate:Normal     Neuro/Psych PSYCHIATRIC DISORDERS Depression  Neuromuscular disease    GI/Hepatic Neg liver ROS, GERD  Medicated,  Endo/Other  negative endocrine ROS  Renal/GU negative Renal ROS  negative genitourinary   Musculoskeletal   Abdominal   Peds  Hematology negative hematology ROS (+)   Anesthesia Other Findings   Reproductive/Obstetrics negative OB ROS                             Anesthesia Physical Anesthesia Plan  ASA: III  Anesthesia Plan: MAC   Post-op Pain Management:    Induction:   PONV Risk Score and Plan: 2  Airway Management Planned:   Additional Equipment:   Intra-op Plan:   Post-operative Plan:   Informed Consent: I have reviewed the patients History and Physical, chart, labs and discussed the procedure including the risks, benefits and alternatives for the proposed anesthesia with the patient or authorized representative who has indicated his/her understanding and acceptance.     Dental Advisory Given  Plan Discussed with: CRNA  Anesthesia Plan Comments:         Anesthesia Quick Evaluation

## 2019-08-17 ENCOUNTER — Encounter (HOSPITAL_COMMUNITY): Payer: Self-pay | Admitting: Ophthalmology

## 2019-08-24 ENCOUNTER — Encounter (HOSPITAL_COMMUNITY): Payer: 59

## 2019-08-25 NOTE — Progress Notes (Signed)
Patient's Cataract surgery was cancelled. No need for Covid screening. Nothing further needed at this time.

## 2019-08-27 ENCOUNTER — Other Ambulatory Visit (HOSPITAL_COMMUNITY)
Admission: RE | Admit: 2019-08-27 | Discharge: 2019-08-27 | Disposition: A | Discharge status: Home / Self Care | Payer: 59 | Source: Ambulatory Visit | Attending: Ophthalmology | Admitting: Ophthalmology

## 2019-08-30 ENCOUNTER — Ambulatory Visit: Admit: 2019-08-30 | Payer: 59 | Admitting: Ophthalmology

## 2019-08-30 SURGERY — PHACOEMULSIFICATION, CATARACT, WITH IOL INSERTION
Anesthesia: Monitor Anesthesia Care | Laterality: Left

## 2019-09-14 IMAGING — DX DG CHEST 2V
2 series · 2 of 2 positions shown · non-contrast
Comparison: Chest x-ray of May 10, 2012

CLINICAL DATA: Mid chest pain radiating into the left arm began 2
hours prior to presentation. There was associated shortness of
breath and nausea and diaphoresis. Nonsmoker.

EXAM:
CHEST - 2 VIEW

[chest pa]
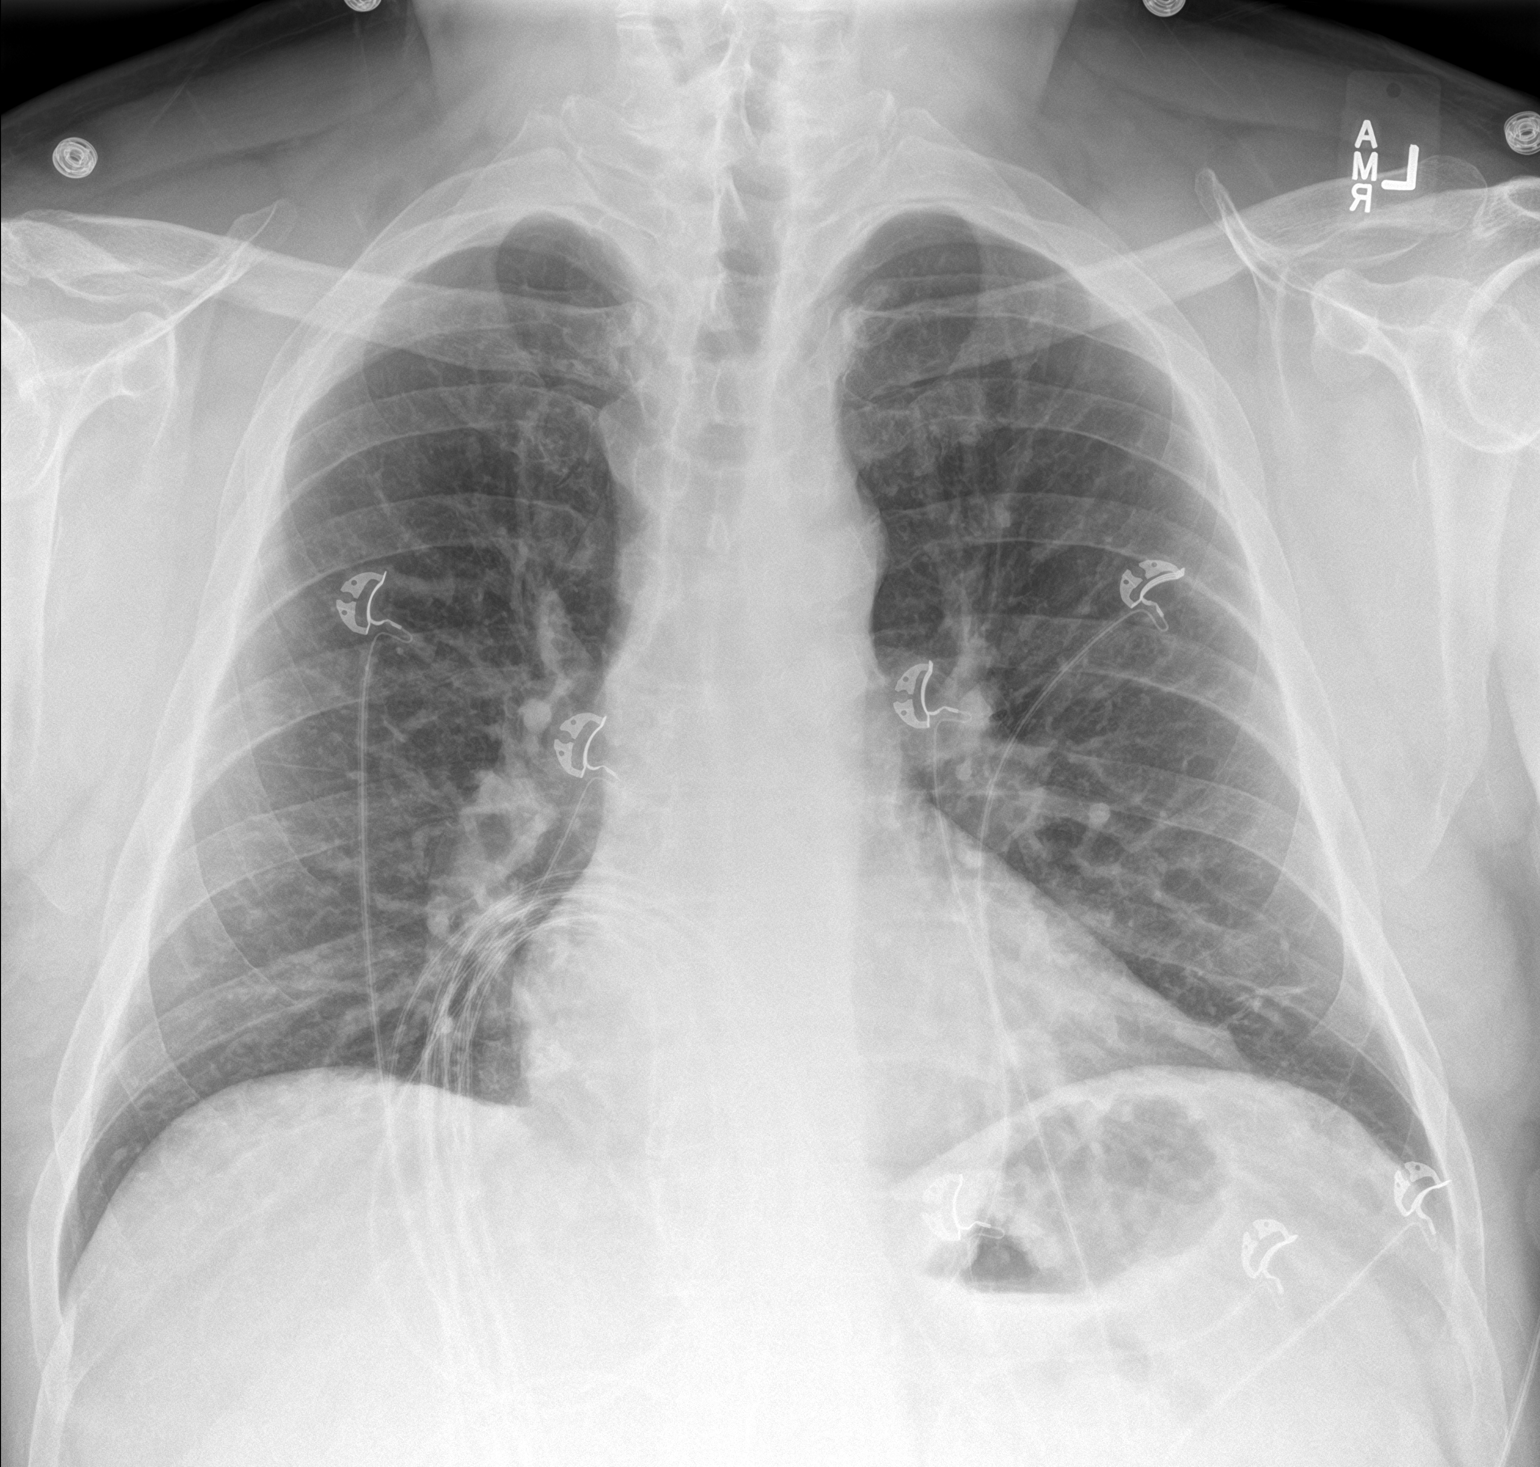

[chest lat]
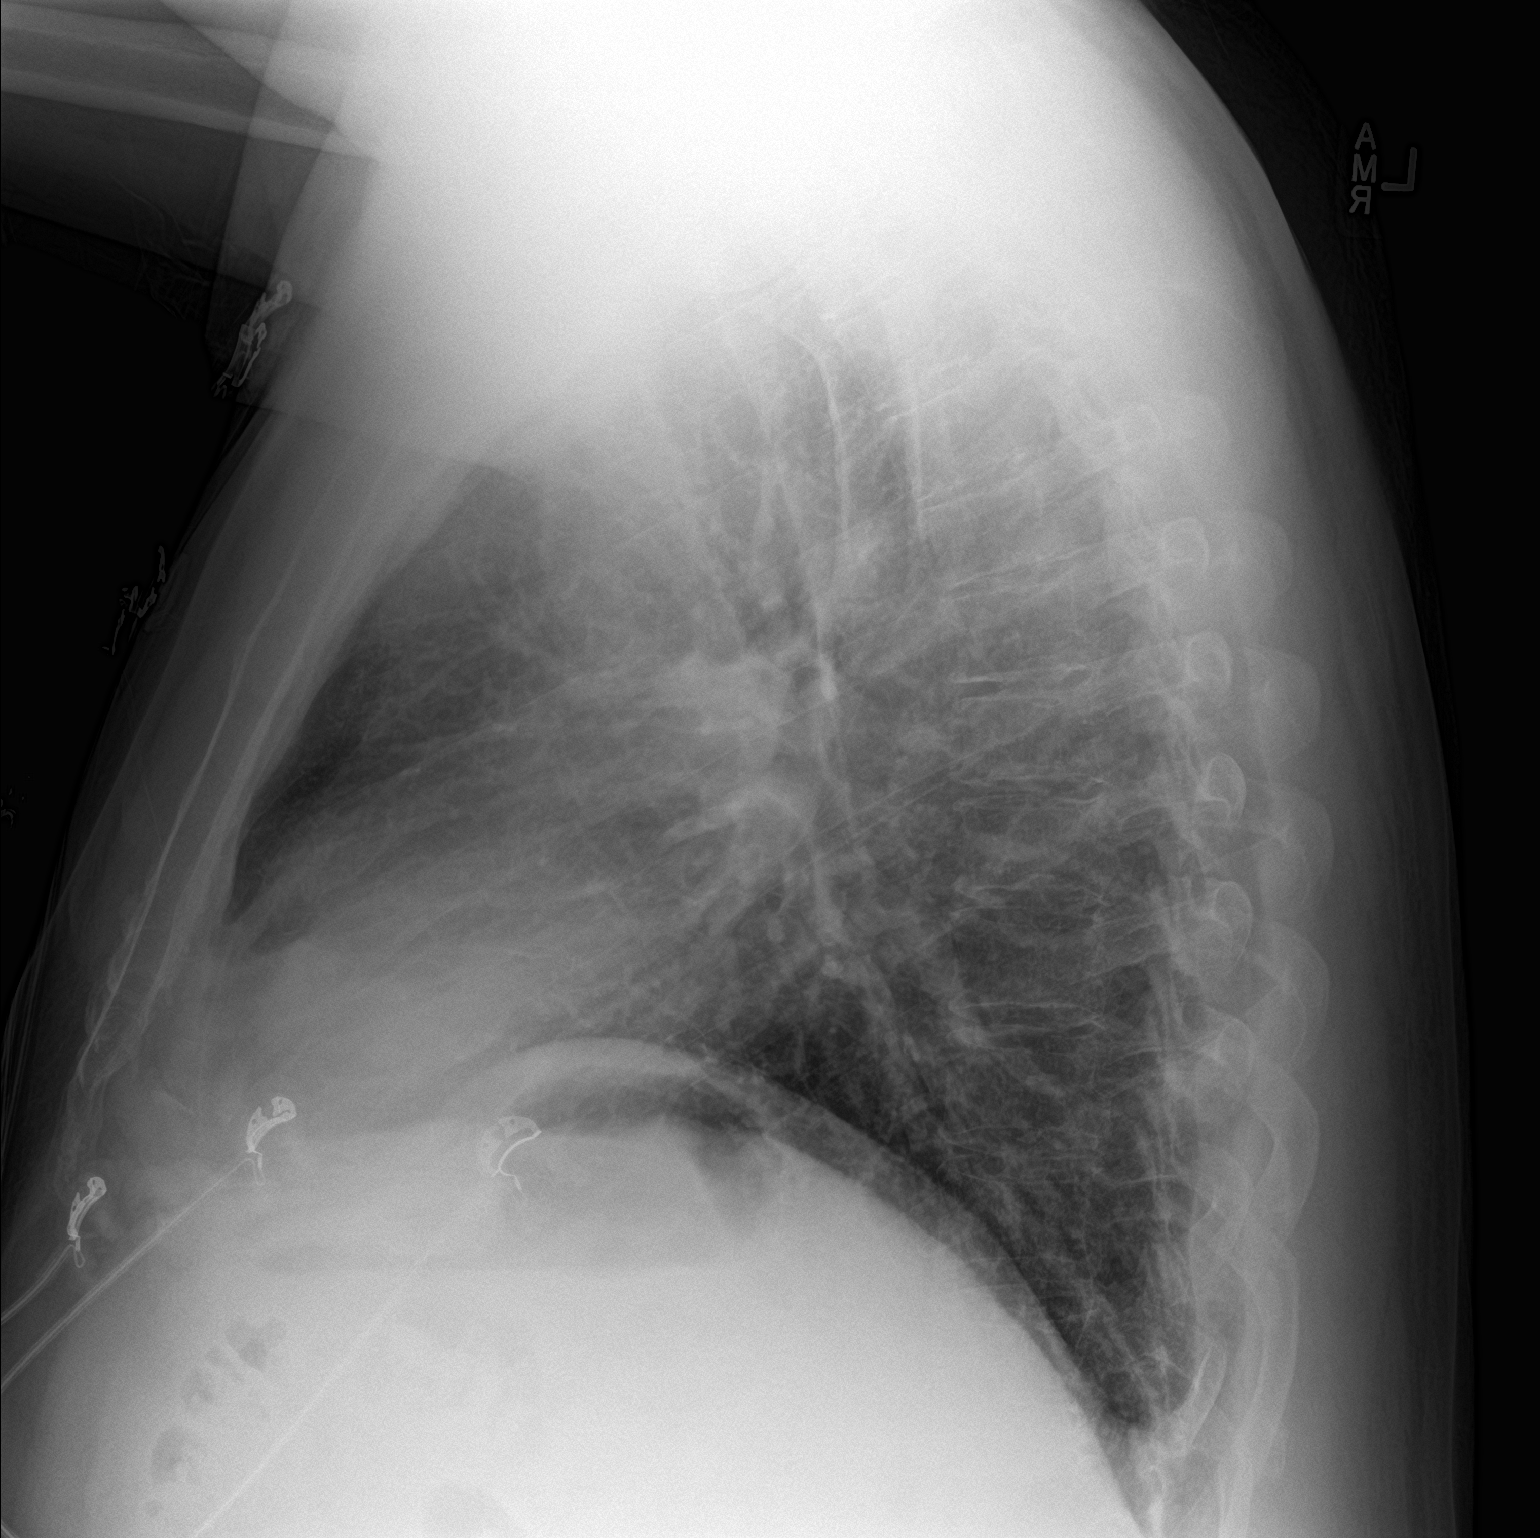

[2 of 2 positions shown; findings below may reference images not displayed]

FINDINGS: The lungs are well-expanded. The interstitial markings are coarse
and slightly more conspicuous today. The heart and pulmonary
vascularity are normal. The mediastinum is normal in width. The
trachea is midline. The bony thorax exhibits no acute abnormality.
IMPRESSION: Mild bilateral interstitial prominence likely reflects bronchitic
changes. No alveolar pneumonia nor pulmonary edema.

## 2019-09-22 NOTE — Telephone Encounter (Signed)
Left message for patient to call back and schedule fasting lab appointment.

## 2019-09-23 NOTE — Telephone Encounter (Signed)
Patient is scheduled for fasting lipids and lfts tomorrow.

## 2019-09-24 ENCOUNTER — Other Ambulatory Visit: Payer: Self-pay | Admitting: *Deleted

## 2019-09-24 ENCOUNTER — Telehealth: Payer: Self-pay | Admitting: Interventional Cardiology

## 2019-09-24 ENCOUNTER — Other Ambulatory Visit: Payer: 59

## 2019-09-24 DIAGNOSIS — E785 Hyperlipidemia, unspecified: Secondary | ICD-10-CM

## 2019-09-24 NOTE — Telephone Encounter (Signed)
New message:    Patient is at [facility] staying he was to have labs there and there is no orders. Patient is there now please call patient.

## 2019-09-24 NOTE — Telephone Encounter (Signed)
Spoke with pt who states that he has already left the hospital and eaten. Notified pt that lab slips have been printed and will be placed at front desk for pick up on the next day he is fasting. Pt voiced understanding and thankful for the call back.

## 2019-09-29 ENCOUNTER — Other Ambulatory Visit (HOSPITAL_COMMUNITY)
Admission: RE | Admit: 2019-09-29 | Discharge: 2019-09-29 | Disposition: A | Discharge status: Home / Self Care | Payer: 59 | Source: Ambulatory Visit | Attending: Interventional Cardiology | Admitting: Interventional Cardiology

## 2019-09-29 DIAGNOSIS — E785 Hyperlipidemia, unspecified: Secondary | ICD-10-CM | POA: Insufficient documentation

## 2019-09-29 LAB — HEPATIC FUNCTION PANEL
ALT: 61 U/L — ABNORMAL HIGH (ref 0–44)
AST: 38 U/L (ref 15–41)
Albumin: 4.2 g/dL (ref 3.5–5.0)
Alkaline Phosphatase: 53 U/L (ref 38–126)
Bilirubin, Direct: 0.1 mg/dL (ref 0.0–0.2)
Indirect Bilirubin: 0.9 mg/dL (ref 0.3–0.9)
Total Bilirubin: 1 mg/dL (ref 0.3–1.2)
Total Protein: 7.4 g/dL (ref 6.5–8.1)

## 2019-09-29 LAB — LIPID PANEL
Cholesterol: 155 mg/dL (ref 0–200)
HDL: 36 mg/dL — ABNORMAL LOW (ref >40)
LDL Cholesterol: 89 mg/dL (ref 0–99)
Total CHOL/HDL Ratio: 4.3 RATIO
Triglycerides: 151 mg/dL — ABNORMAL HIGH (ref <150)
VLDL: 30 mg/dL (ref 0–40)

## 2019-10-25 ENCOUNTER — Other Ambulatory Visit: Payer: 59

## 2019-10-25 ENCOUNTER — Other Ambulatory Visit: Payer: Self-pay | Admitting: Critical Care Medicine

## 2019-10-25 DIAGNOSIS — Z20822 Contact with and (suspected) exposure to covid-19: Secondary | ICD-10-CM

## 2019-10-27 LAB — NOVEL CORONAVIRUS, NAA: SARS-CoV-2, NAA: DETECTED — AB

## 2019-10-27 LAB — SARS-COV-2, NAA 2 DAY TAT

## 2019-10-27 LAB — SPECIMEN STATUS REPORT

## 2019-10-28 ENCOUNTER — Encounter: Payer: Self-pay | Admitting: Physician Assistant

## 2019-10-30 ENCOUNTER — Emergency Department (HOSPITAL_COMMUNITY)
Admission: EM | Admit: 2019-10-30 | Discharge: 2019-10-31 | Disposition: A | Discharge status: Home / Self Care | Payer: 59 | Attending: Emergency Medicine | Admitting: Emergency Medicine

## 2019-10-30 ENCOUNTER — Other Ambulatory Visit: Payer: Self-pay

## 2019-10-30 ENCOUNTER — Emergency Department (HOSPITAL_COMMUNITY): Payer: 59

## 2019-10-30 DIAGNOSIS — U071 COVID-19: Secondary | ICD-10-CM | POA: Insufficient documentation

## 2019-10-30 DIAGNOSIS — I10 Essential (primary) hypertension: Secondary | ICD-10-CM | POA: Insufficient documentation

## 2019-10-30 DIAGNOSIS — R5383 Other fatigue: Secondary | ICD-10-CM | POA: Diagnosis present

## 2019-10-30 DIAGNOSIS — Z7982 Long term (current) use of aspirin: Secondary | ICD-10-CM | POA: Insufficient documentation

## 2019-10-30 DIAGNOSIS — Z79899 Other long term (current) drug therapy: Secondary | ICD-10-CM | POA: Insufficient documentation

## 2019-10-30 MED ORDER — FAMOTIDINE IN NACL 20-0.9 MG/50ML-% IV SOLN: 20.0000 mg | Freq: Once | INTRAVENOUS | Status: DC | PRN

## 2019-10-30 MED ORDER — EPINEPHRINE 0.3 MG/0.3ML IJ SOAJ: 0.3000 mg | Freq: Once | INTRAMUSCULAR | Status: DC | PRN

## 2019-10-30 MED ORDER — SODIUM CHLORIDE 0.9 % IV SOLN
1200.0000 mg | Freq: Once | INTRAVENOUS | Status: AC
  Administered 2019-10-31: 1200 mg via INTRAVENOUS
  Filled 2019-10-30: qty 10

## 2019-10-30 MED ORDER — METHYLPREDNISOLONE SODIUM SUCC 125 MG IJ SOLR: 125.0000 mg | Freq: Once | INTRAMUSCULAR | Status: DC | PRN

## 2019-10-30 MED ORDER — ALBUTEROL SULFATE HFA 108 (90 BASE) MCG/ACT IN AERS: 2.0000 | INHALATION_SPRAY | Freq: Once | RESPIRATORY_TRACT | Status: DC | PRN

## 2019-10-30 MED ORDER — DIPHENHYDRAMINE HCL 50 MG/ML IJ SOLN: 50.0000 mg | Freq: Once | INTRAMUSCULAR | Status: DC | PRN

## 2019-10-30 MED ORDER — SODIUM CHLORIDE 0.9 % IV SOLN: INTRAVENOUS | Status: DC | PRN

## 2019-10-30 NOTE — ED Provider Notes (Signed)
[facility] EMERGENCY DEPARTMENT Provider Note   CSN: [id number] Arrival date & time: 10/30/19  2140     History Chief Complaint  Patient presents with  . Covid Positive    [name] is a 59 y.o. male.  Patient presents with complaints of fatigue, generalized body aches, fever, chills and cough.  Patient tested positive for Covid 4 days ago.        Past Medical History:  Diagnosis Date  . Arthritis    "right knee"  . Depression   . Dysrhythmia   . GERD (gastroesophageal reflux disease)   . H/O cardiac catheterization    a.  09/23/2017: cath showed mild nonobstructive CAD with 10% proximal LAD stenosis and 10% mid LCx stenosis with a preserved EF of 55 to 65%.  . High cholesterol   . History of kidney stones    "33 years ago"  . Hypertension   . Irregular heart rhythm    a. PVC's by prior event monitor.   . Pneumonia   . Sleep apnea    wears CPAP    Patient Active Problem List   Diagnosis Date Noted  . Abnormal nuclear stress test   . History of DVT (deep vein thrombosis) 08/26/2017  . History of atrial fibrillation 08/26/2017  . Chronic back pain greater than 3 months duration 08/26/2017  . HNP (herniated nucleus pulposus), lumbar 05/08/2017  . Essential hypertension 10/15/2014  . Hyperlipidemia 10/15/2014  . Obstructive sleep apnea 10/15/2014  . Memory loss 10/14/2014  . Depression 10/14/2014  . Chest pain 05/15/2012  . PVC (premature ventricular contraction) 05/15/2012  . CARPAL TUNNEL SYNDROME 12/14/2007    Past Surgical History:  Procedure Laterality Date  . BACK SURGERY    . CATARACT EXTRACTION W/PHACO Right 08/16/2019   Procedure: CATARACT EXTRACTION PHACO AND INTRAOCULAR LENS PLACEMENT RIGHT EYE;  Surgeon: Fabio Pierce, MD;  Location: [facility];  Service: Ophthalmology;  Laterality: Right;  CDE: 4.56  . HERNIA REPAIR    . KNEE SURGERY Right    x2  . LEFT HEART CATH AND CORONARY ANGIOGRAPHY N/A 09/23/2017   Procedure: LEFT HEART CATH AND  CORONARY ANGIOGRAPHY;  Surgeon: Corky Crafts, MD;  Location: [facility] INVASIVE CV LAB;  Service: Cardiovascular;  Laterality: N/A;  . LUMBAR LAMINECTOMY/DECOMPRESSION MICRODISCECTOMY N/A 05/08/2017   Procedure: Microlumbar decompression Lumbar two-three;  Surgeon: Jene Every, MD;  Location: [facility] OR;  Service: Orthopedics;  Laterality: N/A;  . LUMBAR LAMINECTOMY/DECOMPRESSION MICRODISCECTOMY N/A 05/16/2017   Procedure: REVISION LUMBAR DECOMPRESSION LUMBAR TWO TO LUMBAR THREE;  Surgeon: Jene Every, MD;  Location: [facility];  Service: Orthopedics;  Laterality: N/A;       Family History  Problem Relation Age of Onset  . Coronary artery disease Father   . Diabetes Father   . Heart disease Mother     Social History   Tobacco Use  . Smoking status: Never Smoker  . Smokeless tobacco: Never Used  Vaping Use  . Vaping Use: Never used  Substance Use Topics  . Alcohol use: Yes    Alcohol/week: 0.0 standard drinks    Comment: 3-4 beers a month "if that"  . Drug use: No    Home Medications Prior to Admission medications   Medication Sig Start Date End Date Taking? Authorizing Provider  amLODipine (NORVASC) 5 MG tablet Take 1 tablet (5 mg total) by mouth daily. 06/16/19 09/14/19  Strader, Lennart Pall, PA-C  aspirin EC 81 MG tablet Take 1 tablet (81 mg total) by mouth daily. Resume  4 days post-op Patient taking differently: Take 81 mg by mouth every morning.  05/16/17   Dorothy Spark, PA-C  atorvastatin (LIPITOR) 40 MG tablet Take 1 tablet (40 mg total) by mouth daily. 06/23/19 09/21/19  Corky Crafts, MD  escitalopram (LEXAPRO) 20 MG tablet Take 20 mg by mouth every morning.     [provider]  gabapentin (NEURONTIN) 300 MG capsule Take 1 capsule (300 mg total) by mouth 3 (three) times daily. Patient taking differently: Take 900 mg by mouth at bedtime.  05/09/17   Dorothy Spark, PA-C  meloxicam (MOBIC) 7.5 MG tablet Take 7.5 mg by mouth daily.  02/03/19   [provider]  metoprolol tartrate (LOPRESSOR) 25 MG tablet Take 0.5 tablets (12.5 mg total) by mouth in the morning and at bedtime. 08/11/19 11/09/19  Strader, Lennart Pall, PA-C  nitroGLYCERIN (NITROSTAT) 0.4 MG SL tablet Place 1 tablet (0.4 mg total) under the tongue every 5 (five) minutes as needed for chest pain. 09/03/17   Strader, Lennart Pall, PA-C  quinapril (ACCUPRIL) 20 MG tablet Take 20 mg by mouth every morning.     [provider]    Allergies    Iodinated diagnostic agents and Iohexol  Review of Systems   Review of Systems  Constitutional: Positive for chills and fever.  Respiratory: Positive for cough and shortness of breath.   Musculoskeletal: Positive for myalgias.  All other systems reviewed and are negative.   Physical Exam Updated Vital Signs BP 128/77   Pulse (!) 56   Temp (!) 100.6 F (38.1 C) (Oral)   Resp 14   Ht 6\' 3"  (1.905 m)   Wt 129.3 kg   SpO2 96%   BMI 35.62 kg/m   Physical Exam Vitals and nursing note reviewed.  Constitutional:      General: He is not in acute distress.    Appearance: Normal appearance. He is well-developed.  HENT:     Head: Normocephalic and atraumatic.     Right Ear: Hearing normal.     Left Ear: Hearing normal.     Nose: Nose normal.  Eyes:     Conjunctiva/sclera: Conjunctivae normal.     Pupils: Pupils are equal, round, and reactive to light.  Cardiovascular:     Rate and Rhythm: Regular rhythm.     Heart sounds: S1 normal and S2 normal. No murmur heard.  No friction rub. No gallop.   Pulmonary:     Effort: Pulmonary effort is normal. No respiratory distress.     Breath sounds: Normal breath sounds.  Chest:     Chest wall: No tenderness.  Abdominal:     General: Bowel sounds are normal.     Palpations: Abdomen is soft.     Tenderness: There is no abdominal tenderness. There is no guarding or rebound. Negative signs include Murphy's sign and McBurney's sign.     Hernia: No hernia is present.    Musculoskeletal:        General: Normal range of motion.     Cervical back: Normal range of motion and neck supple.  Skin:    General: Skin is warm and dry.     Findings: No rash.  Neurological:     Mental Status: He is alert and oriented to person, place, and time.     GCS: GCS eye subscore is 4. GCS verbal subscore is 5. GCS motor subscore is 6.     Cranial Nerves: No cranial nerve deficit.  Sensory: No sensory deficit.     Coordination: Coordination normal.  Psychiatric:        Speech: Speech normal.        Behavior: Behavior normal.        Thought Content: Thought content normal.     ED Results / Procedures / Treatments   Labs (all labs ordered are listed, but only abnormal results are displayed) Labs Reviewed - No data to display  EKG None  Radiology DG Chest Portable 1 View  Result Date: 10/30/2019 CLINICAL DATA:  Shortness of breath. COVID positive 4 days ago. Cough and fever. EXAM: PORTABLE CHEST 1 VIEW COMPARISON:  08/26/2017 FINDINGS: Subsegmental opacities in both lung bases. Unchanged heart size and mediastinal contours. No pulmonary edema, pleural effusion, or pneumothorax. No acute osseous abnormalities are seen. IMPRESSION: Subsegmental bibasilar opacities may represent atelectasis or pneumonia in the setting of COVID 19. Electronically Signed   By: Narda Rutherford M.D.   On: 10/30/2019 22:48    Procedures Procedures (including critical care time)  Medications Ordered in ED Medications  casirivimab-imdevimab (REGEN-COV) 1,200 mg in sodium chloride 0.9 % 110 mL IVPB (1,200 mg Intravenous New Bag/Given 10/31/19 0032)  0.9 %  sodium chloride infusion (has no administration in time range)  diphenhydrAMINE (BENADRYL) injection 50 mg (has no administration in time range)  famotidine (PEPCID) IVPB 20 mg premix (has no administration in time range)  methylPREDNISolone sodium succinate (SOLU-MEDROL) 125 mg/2 mL injection 125 mg (has no administration in time  range)  albuterol (VENTOLIN HFA) 108 (90 Base) MCG/ACT inhaler 2 puff (has no administration in time range)  EPINEPHrine (EPI-PEN) injection 0.3 mg (has no administration in time range)    ED Course  I have reviewed the triage vital signs and the nursing notes.  Pertinent labs & imaging results that were available during my care of the patient were reviewed by me and considered in my medical decision making (see chart for details).    MDM Rules/Calculators/A&P                          Patient presents to the emergency department for evaluation of symptoms from Covid infection.  Chest x-ray does show early bilateral infiltrates consistent with Covid pneumonitis.  He is not hypoxic.  Patient will not require hospitalization.  He does have several risk factors for severe Covid, however and therefore will administer a monoclonal antibodies.  Final Clinical Impression(s) / ED Diagnoses Final diagnoses:  COVID-19    Rx / DC Orders ED Discharge Orders    None       Latausha Flamm, Canary Brim, MD 10/31/19 [phone]

## 2019-10-30 NOTE — ED Triage Notes (Signed)
Pt tested positive for Covid 4 days ago. Pt c/o fevers, cough, body aches, severe HA and lost of taste and smell.

## 2019-10-31 MED ORDER — DEXAMETHASONE SODIUM PHOSPHATE 10 MG/ML IJ SOLN
10.0000 mg | Freq: Once | INTRAMUSCULAR | Status: AC
  Administered 2019-10-31: 10 mg via INTRAVENOUS
  Filled 2019-10-31: qty 1

## 2019-10-31 MED ORDER — BENZONATATE 200 MG PO CAPS
200.0000 mg | ORAL_CAPSULE | Freq: Three times a day (TID) | ORAL | 0 refills | Status: DC | PRN
Start: 2019-10-31 — End: 2021-01-19

## 2019-10-31 NOTE — ED Notes (Signed)
Pt ambulatory to waiting room. Pt verbalized understanding of discharge instructions.   

## 2019-11-25 ENCOUNTER — Encounter (HOSPITAL_COMMUNITY): Payer: 59

## 2019-11-26 ENCOUNTER — Other Ambulatory Visit (HOSPITAL_COMMUNITY): Payer: 59

## 2019-11-29 ENCOUNTER — Ambulatory Visit (HOSPITAL_COMMUNITY): Admission: RE | Admit: 2019-11-29 | Payer: 59 | Admitting: Ophthalmology

## 2019-11-29 ENCOUNTER — Encounter (HOSPITAL_COMMUNITY): Admission: RE | Payer: Self-pay

## 2019-11-29 SURGERY — PHACOEMULSIFICATION, CATARACT, WITH IOL INSERTION
Anesthesia: Monitor Anesthesia Care | Laterality: Left

## 2019-12-27 ENCOUNTER — Other Ambulatory Visit (HOSPITAL_COMMUNITY): Payer: Self-pay | Admitting: Specialist

## 2019-12-27 DIAGNOSIS — M25512 Pain in left shoulder: Secondary | ICD-10-CM

## 2019-12-29 ENCOUNTER — Other Ambulatory Visit: Payer: Self-pay | Admitting: Physician Assistant

## 2020-01-07 ENCOUNTER — Ambulatory Visit (HOSPITAL_COMMUNITY)
Admission: RE | Admit: 2020-01-07 | Discharge: 2020-01-07 | Disposition: A | Discharge status: Home / Self Care | Payer: 59 | Source: Ambulatory Visit | Attending: Specialist | Admitting: Specialist

## 2020-01-07 ENCOUNTER — Other Ambulatory Visit: Payer: Self-pay

## 2020-01-07 DIAGNOSIS — M25512 Pain in left shoulder: Secondary | ICD-10-CM | POA: Diagnosis present

## 2020-06-15 ENCOUNTER — Ambulatory Visit: Payer: 59 | Admitting: Student

## 2020-07-12 ENCOUNTER — Other Ambulatory Visit: Payer: Self-pay | Admitting: Student

## 2020-07-12 NOTE — Telephone Encounter (Signed)
This is a [place] pt, Dr. McDowell 

## 2020-08-11 ENCOUNTER — Other Ambulatory Visit: Payer: Self-pay | Admitting: Cardiology

## 2020-09-20 ENCOUNTER — Other Ambulatory Visit: Payer: Self-pay | Admitting: Student

## 2020-11-15 ENCOUNTER — Telehealth: Payer: Self-pay | Admitting: Student

## 2020-11-15 MED ORDER — QUINAPRIL HCL 20 MG PO TABS
20.0000 mg | ORAL_TABLET | Freq: Every morning | ORAL | 1 refills | Status: DC
Start: 2020-11-15 — End: 2021-01-19

## 2020-11-15 MED ORDER — METOPROLOL TARTRATE 25 MG PO TABS: 12.5000 mg | ORAL_TABLET | Freq: Two times a day (BID) | ORAL | 1 refills | Status: DC

## 2020-11-15 MED ORDER — AMLODIPINE BESYLATE 5 MG PO TABS
ORAL_TABLET | ORAL | 1 refills | Status: DC
Start: 2020-11-15 — End: 2021-01-19

## 2020-11-15 NOTE — Telephone Encounter (Signed)
Complete. Pt has appt with B.Strader on 11/10

## 2020-11-15 NOTE — Telephone Encounter (Signed)
*  STAT* If patient is at the pharmacy, call can be transferred to refill team.   1. Which medications need to be refilled? (please list name of each medication and dose if known) amLODipine (NORVASC) 5 MG tablet metoprolol tartrate (LOPRESSOR) 25 MG tablet (Expired)   quinapril (ACCUPRIL) 20 MG tablet    2. Which pharmacy/location (including street and city if local pharmacy) is medication to be sent to? [facility] pharmacy  3. Do they need a 30 day or 90 day supply? 30

## 2020-12-14 ENCOUNTER — Ambulatory Visit: Payer: Self-pay | Admitting: Student

## 2021-01-19 ENCOUNTER — Ambulatory Visit (INDEPENDENT_AMBULATORY_CARE_PROVIDER_SITE_OTHER): Payer: Commercial Managed Care - PPO | Admitting: Student

## 2021-01-19 ENCOUNTER — Other Ambulatory Visit: Payer: Self-pay

## 2021-01-19 ENCOUNTER — Encounter: Payer: Self-pay | Admitting: Student

## 2021-01-19 VITALS — BP 142/88 | HR 65 | Ht 75.0 in | Wt 278.0 lb

## 2021-01-19 DIAGNOSIS — I10 Essential (primary) hypertension: Secondary | ICD-10-CM

## 2021-01-19 DIAGNOSIS — G4733 Obstructive sleep apnea (adult) (pediatric): Secondary | ICD-10-CM

## 2021-01-19 DIAGNOSIS — E785 Hyperlipidemia, unspecified: Secondary | ICD-10-CM

## 2021-01-19 DIAGNOSIS — R002 Palpitations: Secondary | ICD-10-CM | POA: Diagnosis not present

## 2021-01-19 DIAGNOSIS — I2510 Atherosclerotic heart disease of native coronary artery without angina pectoris: Secondary | ICD-10-CM

## 2021-01-19 MED ORDER — ATORVASTATIN CALCIUM 40 MG PO TABS: 40.0000 mg | ORAL_TABLET | Freq: Every day | ORAL | 3 refills | Status: DC

## 2021-01-19 MED ORDER — AMLODIPINE BESYLATE 5 MG PO TABS: ORAL_TABLET | ORAL | 3 refills | Status: DC

## 2021-01-19 MED ORDER — QUINAPRIL HCL 20 MG PO TABS: 20.0000 mg | ORAL_TABLET | Freq: Every morning | ORAL | 3 refills | Status: DC

## 2021-01-19 NOTE — Progress Notes (Signed)
Cardiology Office Note    Date:  01/19/2021   ID:  [name], DOB Apr 24, 1960, MRN [id number]  PCP:  Elfredia Nevins, MD  Cardiologist: Nona Dell, MD    Chief Complaint  Patient presents with   Follow-up    Overdue Visit    History of Present Illness:    [name] is a 60 y.o. male with past medical history of CAD (nonobstructive CAD by catheterization in 09/2017), palpitations (prior monitor in 08/2019 showing PAC's and PVC's with brief SVT), HTN, HLD and OSA (on CPAP) who presents to the office today for overdue follow-up.  He was last examined by myself in 06/2019 and reported baseline dyspnea on exertion but denied any associated chest pain. He did report occasional palpitations and his heart rate was in the 40's during his visit, therefore Cardizem CD 120mg  daily was discontinued and transitioned to Amlodipine 5 mg daily for BP control. He did call the office the following month reporting worsening palpitations and was started on low-dose Lopressor 12.5 mg twice daily. He ultimately wore an event monitor which showed PAC's and PVC's (less than 2% burden) along with brief episodes of SVT. There were no sustained arrhythmias or pauses.  In talking with the patient today, he reports overall doing well since his last office visit. He is now working as a at [employer]. He denies any specific exertional chest pain or progressive dyspnea on exertion. No reported orthopnea, PND or lower extremity edema. Utilizes CPAP on a nightly basis.  He does experience occasional palpitations which last for a few minutes and spontaneously resolve. Reports associated dizziness when this occurs. Symptoms are overall infrequent and occur 1-2 times per month but he can sometimes go several months without symptoms. He has not noticed any association with caffeine intake. He is no longer on Lopressor as he reports having lost insurance coverage in the past but this is now back in  place. Did not really notice significant improvement in symptoms on the low-dose of 12.5mg  BID.   Past Medical History:  Diagnosis Date   Arthritis    "right knee"   Depression    Dysrhythmia    GERD (gastroesophageal reflux disease)    H/O cardiac catheterization    a.  09/23/2017: cath showed mild nonobstructive CAD with 10% proximal LAD stenosis and 10% mid LCx stenosis with a preserved EF of 55 to 65%.   High cholesterol    History of kidney stones    "33 years ago"   Hypertension    Irregular heart rhythm    a. PVC's by prior event monitor.    Pneumonia    Sleep apnea    wears CPAP    Past Surgical History:  Procedure Laterality Date   BACK SURGERY     CATARACT EXTRACTION W/PHACO Right 08/16/2019   Procedure: CATARACT EXTRACTION PHACO AND INTRAOCULAR LENS PLACEMENT RIGHT EYE;  Surgeon: 10/17/2019, MD;  Location: [facility];  Service: Ophthalmology;  Laterality: Right;  CDE: 4.56   HERNIA REPAIR     KNEE SURGERY Right    x2   LEFT HEART CATH AND CORONARY ANGIOGRAPHY N/A 09/23/2017   Procedure: LEFT HEART CATH AND CORONARY ANGIOGRAPHY;  Surgeon: 09/25/2017, MD;  Location: [facility] INVASIVE CV LAB;  Service: Cardiovascular;  Laterality: N/A;   LUMBAR LAMINECTOMY/DECOMPRESSION MICRODISCECTOMY N/A 05/08/2017   Procedure: Microlumbar decompression Lumbar two-three;  Surgeon: 07/08/2017, MD;  Location: [facility] OR;  Service: Orthopedics;  Laterality: N/A;  LUMBAR LAMINECTOMY/DECOMPRESSION MICRODISCECTOMY N/A 05/16/2017   Procedure: REVISION LUMBAR DECOMPRESSION LUMBAR TWO TO LUMBAR THREE;  Surgeon: Jene Every, MD;  Location: [facility];  Service: Orthopedics;  Laterality: N/A;    Current Medications: Outpatient Medications Prior to Visit  Medication Sig Dispense Refill   aspirin EC 81 MG tablet Take 1 tablet (81 mg total) by mouth daily. Resume 4 days post-op (Patient taking differently: Take 81 mg by mouth every morning.)     escitalopram (LEXAPRO) 20 MG tablet Take 20 mg by  mouth every morning.      meloxicam (MOBIC) 15 MG tablet Take 15 mg by mouth daily.     nitroGLYCERIN (NITROSTAT) 0.4 MG SL tablet Place 1 tablet (0.4 mg total) under the tongue every 5 (five) minutes as needed for chest pain. 25 tablet 2   amLODipine (NORVASC) 5 MG tablet TAKE (1) TABLET BY MOUTH ONCE DAILY. 30 tablet 1   quinapril (ACCUPRIL) 20 MG tablet Take 1 tablet (20 mg total) by mouth every morning. 30 tablet 1   atorvastatin (LIPITOR) 40 MG tablet TAKE (1) TABLET BY MOUTH ONCE DAILY. (Patient not taking: Reported on 01/19/2021) 30 tablet 0   benzonatate (TESSALON) 200 MG capsule Take 1 capsule (200 mg total) by mouth 3 (three) times daily as needed for cough. (Patient not taking: Reported on 01/19/2021) 20 capsule 0   gabapentin (NEURONTIN) 300 MG capsule Take 1 capsule (300 mg total) by mouth 3 (three) times daily. (Patient not taking: Reported on 01/19/2021) 90 capsule 1   meloxicam (MOBIC) 7.5 MG tablet Take 7.5 mg by mouth daily.  (Patient not taking: Reported on 01/19/2021)     metoprolol tartrate (LOPRESSOR) 25 MG tablet Take 0.5 tablets (12.5 mg total) by mouth in the morning and at bedtime. (Patient not taking: Reported on 01/19/2021) 30 tablet 1   No facility-administered medications prior to visit.     Allergies:   Iodinated diagnostic agents and Iohexol   Social History   Socioeconomic History   Marital status: Married    Spouse name: Not on file   Number of children: Not on file   Years of education: Not on file   Highest education level: Not on file  Occupational History   Not on file  Tobacco Use   Smoking status: Never   Smokeless tobacco: Never  Vaping Use   Vaping Use: Never used  Substance and Sexual Activity   Alcohol use: Yes    Alcohol/week: 0.0 standard drinks    Comment: 3-4 beers a month "if that"   Drug use: No   Sexual activity: Not on file  Other Topics Concern   Not on file  Social History Narrative   Not on file   Social Determinants of  Health   Financial Resource Strain: Not on file  Food Insecurity: Not on file  Transportation Needs: Not on file  Physical Activity: Not on file  Stress: Not on file  Social Connections: Not on file     Family History:  The patient's family history includes Coronary artery disease in his father; Diabetes in his father; Heart disease in his mother.   Review of Systems:    Please see the history of present illness.     All other systems reviewed and are otherwise negative except as noted above.   Physical Exam:    VS:  BP (!) 142/88    Pulse 65    Ht 6\' 3"  (1.905 m)    Wt 278 lb (126.1 kg)  SpO2 99%    BMI 34.75 kg/m    General: Well developed, well nourished,male appearing in no acute distress. Head: Normocephalic, atraumatic. Neck: No carotid bruits. JVD not elevated.  Lungs: Respirations regular and unlabored, without wheezes or rales.  Heart: Regular rate and rhythm. No S3 or S4.  No murmur, no rubs, or gallops appreciated. Abdomen: Appears non-distended. No obvious abdominal masses. Msk:  Strength and tone appear normal for age. No obvious joint deformities or effusions. Extremities: No clubbing or cyanosis. No pitting edema.  Distal pedal pulses are 2+ bilaterally. Neuro: Alert and oriented X 3. Moves all extremities spontaneously. No focal deficits noted. Psych:  Responds to questions appropriately with a normal affect. Skin: No rashes or lesions noted  Wt Readings from Last 3 Encounters:  01/19/21 278 lb (126.1 kg)  10/30/19 285 lb (129.3 kg)  06/16/19 287 lb (130.2 kg)     Studies/Labs Reviewed:   EKG:  EKG is ordered today. The ekg ordered today demonstrates NSR, HR 65 with no acute ST changes when compared to prior tracings.  Recent Labs: No results found for requested labs within last 8760 hours.   Lipid Panel    Component Value Date/Time   CHOL 155 09/29/2019 0908   TRIG 151 (H) 09/29/2019 0908   HDL 36 (L) 09/29/2019 0908   CHOLHDL 4.3 09/29/2019  0908   VLDL 30 09/29/2019 0908   LDLCALC 89 09/29/2019 0908    Additional studies/ records that were reviewed today include:   Cardiac Catheterization: 09/2017 Prox LAD lesion is 10% stenosed. Mid Cx lesion is 10% stenosed. LV end diastolic pressure is mildly elevated. The left ventricular ejection fraction is 55-65% by visual estimate.     Nonobstructive CAD.  Continue preventive therapy.    Event Monitor: 08/2019 ZIO XT reviewed.  8 days 18 hours analyzed.  Predominant rhythm is sinus with heart rate ranging from 37 bpm up to 154 bpm and average heart rate 59 bpm.  Rare PACs were noted representing less than 1% of total beats.  Occasional PVCs noted representing 2% of total beats.  Patient triggered events tended to correlate with PVCs.  Two episodes of SVT were noted, the longest of which lasted for only 7 beats.  There were no sustained arrhythmias or pauses.  Assessment:    1. Coronary artery disease involving native coronary artery of native heart without angina pectoris   2. Palpitations   3. Essential hypertension   4. Hyperlipidemia LDL goal <70   5. OSA (obstructive sleep apnea)      Plan:   In order of problems listed above:  1. CAD - He had nonobstructive CAD by catheterization in 09/2017 as outlined above. He is not overly active at baseline secondary to arthritis but denies any recent anginal symptoms. He has been off Atorvastatin and will plan to restart this as outlined below. Continue ASA 81 mg daily.  2. Palpitations - Prior monitor in 08/2019 showed PAC's and PVC's with brief SVT as outlined above. He has been off Lopressor and we reviewed options today in regards to resuming this. He wishes to hold off for now given the infrequency of his symptoms. I encouraged him to make [place] aware if symptoms become more frequent as we could switch to Toprol-XL for improved compliance as he prefers to take medications once daily. Could also consider a repeat echocardiogram  given his most recent imaging was in 05/2012 but EF was normal by catheterization in 2019.  3. HTN - His  blood pressure was initially recorded at 148/88, rechecked and at 142/88. He did come directly from work to the office today and I encouraged him to follow readings at home and report back if this remains elevated.  Will continue his current medication regimen for now with Amlodipine 5 mg daily and Quinapril 20 mg daily.  4. HLD - Recent FLP earlier this month showed total cholesterol 217, triglycerides 113, HDL 42 and LDL 155. LFT's WNL. He is not currently on Atorvastatin and is unsure why this was discontinued in the interim. Reviewed options and will plan to restart Atorvastatin at his prior dose of 40 mg daily given goal LDL less than 70 in the setting of known coronary calcifications.  5. OSA - Continued compliance with CPAP encouraged.  Medication Adjustments/Labs and Tests Ordered: Current medicines are reviewed at length with the patient today.  Concerns regarding medicines are outlined above.  Medication changes, Labs and Tests ordered today are listed in the Patient Instructions below. Patient Instructions  Medication Instructions:   Restart Atorvastatin  daily.   *If you need a refill on your cardiac medications before your next appointment, please call your pharmacy*   Lab Work:  Lipid Panel and Liver Function Panel in 2-3 months. Will need to be FASTING.   Testing/Procedures:  None   Follow-Up: At [facility], you and your health needs are our priority.  As part of our continuing mission to provide you with exceptional heart care, we have created designated Provider Care Teams.  These Care Teams include your primary Cardiologist (physician) and Advanced Practice Providers (APPs -  Physician Assistants and Nurse Practitioners) who all work together to provide you with the care you need, when you need it.  We recommend signing up for the patient portal called  "MyChart".  Sign up information is provided on this After Visit Summary.  MyChart is used to connect with patients for Virtual Visits (Telemedicine).  Patients are able to view lab/test results, encounter notes, upcoming appointments, etc.  Non-urgent messages can be sent to your provider as well.   To learn more about what you can do with MyChart, go to [email or web address].    Your next appointment:   1 year(s)  The format for your next appointment:   In Person  Provider:   You may see Nona Dell, MD or one of the following Advanced Practice Providers on your designated Care Team:   Randall An, PA-C  Jacolyn Reedy, PA-C      Signed, Ellsworth Lennox, [place]  01/19/2021 4:58 PM    [facility] [address] [place], [place] [address] Phone: [phone] Fax: [phone]

## 2021-01-19 NOTE — Patient Instructions (Signed)
Medication Instructions:   Restart Atorvastatin 40mg  daily.   *If you need a refill on your cardiac medications before your next appointment, please call your pharmacy*   Lab Work:  Lipid Panel and Liver Function Panel in 2-3 months. Will need to be FASTING.   Testing/Procedures:  None   Follow-Up: At [facility], you and your health needs are our priority.  As part of our continuing mission to provide you with exceptional heart care, we have created designated Provider Care Teams.  These Care Teams include your primary Cardiologist (physician) and Advanced Practice Providers (APPs -  Physician Assistants and Nurse Practitioners) who all work together to provide you with the care you need, when you need it.  We recommend signing up for the patient portal called "MyChart".  Sign up information is provided on this After Visit Summary.  MyChart is used to connect with patients for Virtual Visits (Telemedicine).  Patients are able to view lab/test results, encounter notes, upcoming appointments, etc.  Non-urgent messages can be sent to your provider as well.   To learn more about what you can do with MyChart, go to [facility].    Your next appointment:   1 year(s)  The format for your next appointment:   In Person  Provider:   You may see [email or web address], MD or one of the following Advanced Practice Providers on your designated Care Team:   [place], PA-C  [place], Jacolyn Reedy

## 2021-02-13 ENCOUNTER — Telehealth: Payer: Self-pay | Admitting: Student

## 2021-02-13 MED ORDER — LISINOPRIL 20 MG PO TABS
20.0000 mg | ORAL_TABLET | Freq: Every day | ORAL | 3 refills | Status: DC
Start: 2021-02-13 — End: 2022-03-01

## 2021-02-13 NOTE — Telephone Encounter (Signed)
Quinapril d/c and replaced with Lisinopril 20 mg tablets QD. Pharmacy aware.

## 2021-02-13 NOTE — Telephone Encounter (Signed)
Pt c/o medication issue:  1. Name of Medication: quinapril (ACCUPRIL) 20 MG tablet  2. How are you currently taking this medication (dosage and times per day)?   3. Are you having a reaction (difficulty breathing--STAT)?   4. What is your medication issue? Pharmacy said there is a voluntary recall on this medication and now there is not any medication to re-order. The pharmacist needs to know what medication this patient can be prescribed as a replacement

## 2021-02-13 NOTE — Telephone Encounter (Signed)
Left a message for pt to call back regarding medication changes.  

## 2021-02-13 NOTE — Telephone Encounter (Signed)
° °  Can switch from Quinapril to Lisinopril 20mg  daily.   Signed, , PA-C 02/13/2021, 11:23 AM

## 2021-05-11 ENCOUNTER — Ambulatory Visit: Payer: Commercial Managed Care - PPO | Admitting: Internal Medicine

## 2021-05-23 ENCOUNTER — Ambulatory Visit (INDEPENDENT_AMBULATORY_CARE_PROVIDER_SITE_OTHER): Payer: Commercial Managed Care - PPO | Admitting: Internal Medicine

## 2021-05-23 ENCOUNTER — Encounter: Payer: Self-pay | Admitting: Internal Medicine

## 2021-05-23 VITALS — BP 138/82 | HR 60 | Resp 18 | Ht 75.0 in | Wt 281.6 lb

## 2021-05-23 DIAGNOSIS — Z125 Encounter for screening for malignant neoplasm of prostate: Secondary | ICD-10-CM | POA: Insufficient documentation

## 2021-05-23 DIAGNOSIS — M5416 Radiculopathy, lumbar region: Secondary | ICD-10-CM

## 2021-05-23 DIAGNOSIS — G4733 Obstructive sleep apnea (adult) (pediatric): Secondary | ICD-10-CM

## 2021-05-23 DIAGNOSIS — Z1211 Encounter for screening for malignant neoplasm of colon: Secondary | ICD-10-CM

## 2021-05-23 DIAGNOSIS — I2510 Atherosclerotic heart disease of native coronary artery without angina pectoris: Secondary | ICD-10-CM | POA: Insufficient documentation

## 2021-05-23 DIAGNOSIS — Z1159 Encounter for screening for other viral diseases: Secondary | ICD-10-CM

## 2021-05-23 DIAGNOSIS — F3341 Major depressive disorder, recurrent, in partial remission: Secondary | ICD-10-CM | POA: Diagnosis not present

## 2021-05-23 DIAGNOSIS — Z0000 Encounter for general adult medical examination without abnormal findings: Secondary | ICD-10-CM

## 2021-05-23 DIAGNOSIS — E559 Vitamin D deficiency, unspecified: Secondary | ICD-10-CM

## 2021-05-23 DIAGNOSIS — I10 Essential (primary) hypertension: Secondary | ICD-10-CM

## 2021-05-23 DIAGNOSIS — I493 Ventricular premature depolarization: Secondary | ICD-10-CM

## 2021-05-23 DIAGNOSIS — E782 Mixed hyperlipidemia: Secondary | ICD-10-CM | POA: Diagnosis not present

## 2021-05-23 MED ORDER — ESCITALOPRAM OXALATE 20 MG PO TABS: 20.0000 mg | ORAL_TABLET | Freq: Every morning | ORAL | 5 refills | Status: AC

## 2021-05-23 NOTE — Assessment & Plan Note (Signed)
Ordered PSA after discussing its limitations for prostate cancer screening, including false positive results leading additional investigations. 

## 2021-05-23 NOTE — Assessment & Plan Note (Signed)
Has had palpitations ?Cardiac monitor showed PVCs with a brief episode of SVT in the past ?Had bradycardia with beta-blocker ?Followed by cardiology ?

## 2021-05-23 NOTE — Patient Instructions (Signed)
Please continue to take medications as prescribed.  Please continue to follow low salt diet and perform moderate exercise/walking at least 150 mins/week.  Please get fasting blood tests done before the next visit. 

## 2021-05-23 NOTE — Assessment & Plan Note (Signed)
History of nonobstructive CAD ?On aspirin and statin ?Followed by cardiology ?

## 2021-05-23 NOTE — Assessment & Plan Note (Signed)
Has had lumbar spine surgery in the past ?Followed by orthopedic surgery at [facility] ?

## 2021-05-23 NOTE — Progress Notes (Signed)
? ?New Patient Office Visit ? ?Subjective:  ?Patient ID: [name], male    DOB: [birth year]  Age: 61 y.o. MRN: [id number] ? ?CC:  ?Chief Complaint  ?Patient presents with  ? New Patient (Initial Visit)  ?  New patient was seeing dr Gerarda Fraction has knee pain but sees ortho for this   ? ? ?HPI ?[name] is a 61 y.o. male with past medical history of CAD, HTN, OSA, lumbar radiculopathy, OA of knee and depression who presents for establishing care. ? ?CAD and HTN: He has history of nonobstructive CAD, for which he was placed on metoprolol, but was having bradycardia with beta-blocker. BP is well-controlled. Takes medications regularly. Patient denies headache, dizziness, chest pain, dyspnea or palpitations. ? ?OSA: Uses CPAP regularly. ? ?History of lumbar radiculopathy and OA of knee: Followed by orthopedic surgery at [facility].  He has had lumbar spine surgery in the past.  He has episodic flareup of back pain when he tries to lift weight or perform frequent bending. ? ?He takes Lexapro for history of depression and anxiety.  He currently denies any anhedonia, SI or HI.  He also has anger issues, which are well controlled with Lexapro. ? ?He has had 1 dose of J&J vaccine. ? ? ? ?Past Medical History:  ?Diagnosis Date  ? Arthritis   ? "right knee"  ? Depression   ? Dysrhythmia   ? GERD (gastroesophageal reflux disease)   ? H/O cardiac catheterization   ? a.  09/23/2017: cath showed mild nonobstructive CAD with 10% proximal LAD stenosis and 10% mid LCx stenosis with a preserved EF of 55 to 65%.  ? High cholesterol   ? History of kidney stones   ? "33 years ago"  ? Hypertension   ? Irregular heart rhythm   ? a. PVC's by prior event monitor.   ? Pneumonia   ? Sleep apnea   ? wears CPAP  ? ? ?Past Surgical History:  ?Procedure Laterality Date  ? BACK SURGERY    ? CATARACT EXTRACTION W/PHACO Right 08/16/2019  ? Procedure: CATARACT EXTRACTION PHACO AND INTRAOCULAR LENS PLACEMENT RIGHT EYE;  Surgeon: Baruch Goldmann,  MD;  Location: [facility];  Service: Ophthalmology;  Laterality: Right;  CDE: 4.56  ? HERNIA REPAIR    ? KNEE SURGERY Right   ? x2  ? LEFT HEART CATH AND CORONARY ANGIOGRAPHY N/A 09/23/2017  ? Procedure: LEFT HEART CATH AND CORONARY ANGIOGRAPHY;  Surgeon: Jettie Booze, MD;  Location: [facility];  Service: Cardiovascular;  Laterality: N/A;  ? LUMBAR LAMINECTOMY/DECOMPRESSION MICRODISCECTOMY N/A 05/08/2017  ? Procedure: Microlumbar decompression Lumbar two-three;  Surgeon: Susa Day, MD;  Location: [facility];  Service: Orthopedics;  Laterality: N/A;  ? LUMBAR LAMINECTOMY/DECOMPRESSION MICRODISCECTOMY N/A 05/16/2017  ? Procedure: REVISION LUMBAR DECOMPRESSION LUMBAR TWO TO LUMBAR THREE;  Surgeon: Susa Day, MD;  Location: [facility];  Service: Orthopedics;  Laterality: N/A;  ? ? ?Family History  ?Problem Relation Age of Onset  ? Coronary artery disease Father   ? Diabetes Father   ? Heart disease Mother   ? ? ?Social History  ? ?Socioeconomic History  ? Marital status: Married  ?  Spouse name: Not on file  ? Number of children: Not on file  ? Years of education: Not on file  ? Highest education level: Not on file  ?Occupational History  ? Not on file  ?Tobacco Use  ? Smoking status: Never  ? Smokeless tobacco: Never  ?Vaping Use  ?  Vaping Use: Never used  ?Substance and Sexual Activity  ? Alcohol use: Yes  ?  Alcohol/week: 0.0 standard drinks  ?  Comment: 3-4 beers a month "if that"  ? Drug use: No  ? Sexual activity: Not on file  ?Other Topics Concern  ? Not on file  ?Social History Narrative  ? Not on file  ? ?Social Determinants of Health  ? ?Financial Resource Strain: Not on file  ?Food Insecurity: Not on file  ?Transportation Needs: Not on file  ?Physical Activity: Not on file  ?Stress: Not on file  ?Social Connections: Not on file  ?Intimate Partner Violence: Not on file  ? ? ?ROS ?Review of Systems  ?Constitutional:  Negative for chills and fever.  ?HENT:  Negative for congestion and sore throat.    ?Eyes:  Negative for pain and discharge.  ?Respiratory:  Negative for cough and shortness of breath.   ?Cardiovascular:  Negative for chest pain and palpitations.  ?Gastrointestinal:  Negative for diarrhea, nausea and vomiting.  ?Endocrine: Negative for polydipsia and polyuria.  ?Genitourinary:  Negative for dysuria and hematuria.  ?Musculoskeletal:  Positive for arthralgias and back pain. Negative for neck pain and neck stiffness.  ?Skin:  Negative for rash.  ?Neurological:  Negative for dizziness, weakness, numbness and headaches.  ?Psychiatric/Behavioral:  Negative for agitation and behavioral problems.   ? ?Objective:  ? ?Today's Vitals: BP 138/82 (BP Location: Left Arm, Patient Position: Sitting, Cuff Size: Normal)   Pulse 60   Resp 18   Ht 6' 3"  (1.905 m)   Wt 281 lb 9.6 oz (127.7 kg)   SpO2 97%   BMI 35.20 kg/m?  ? ?Physical Exam ?Vitals reviewed.  ?Constitutional:   ?   General: He is not in acute distress. ?   Appearance: He is obese. He is not diaphoretic.  ?HENT:  ?   Head: Normocephalic and atraumatic.  ?   Nose: Nose normal.  ?   Mouth/Throat:  ?   Mouth: Mucous membranes are moist.  ?Eyes:  ?   General: No scleral icterus. ?   Extraocular Movements: Extraocular movements intact.  ?Cardiovascular:  ?   Rate and Rhythm: Normal rate and regular rhythm.  ?   Pulses: Normal pulses.  ?   Heart sounds: Normal heart sounds. No murmur heard. ?Pulmonary:  ?   Breath sounds: Normal breath sounds. No wheezing or rales.  ?Musculoskeletal:  ?   Cervical back: Neck supple. No tenderness.  ?   Right lower leg: No edema.  ?   Left lower leg: No edema.  ?Skin: ?   General: Skin is warm.  ?   Findings: No rash.  ?Neurological:  ?   General: No focal deficit present.  ?   Mental Status: He is alert and oriented to person, place, and time.  ?Psychiatric:     ?   Mood and Affect: Mood normal.     ?   Behavior: Behavior normal.  ? ? ?Assessment & Plan:  ? ?Problem List Items Addressed This Visit   ? ?  ?  Cardiovascular and Mediastinum  ? PVC (premature ventricular contraction)  ?  Has had palpitations ?Cardiac monitor showed PVCs with a brief episode of SVT in the past ?Had bradycardia with beta-blocker ?Followed by cardiology ? ?  ?  ? Essential hypertension - Primary  ?  BP Readings from Last 1 Encounters:  ?05/23/21 138/82  ?Well-controlled with amlodipine and lisinopril ?Counseled for compliance with the medications ?Advised DASH diet  and moderate exercise/walking, at least 150 mins/week ? ?  ?  ? Relevant Orders  ? CMP14+EGFR  ? CAD (coronary artery disease)  ?  History of nonobstructive CAD ?On aspirin and statin ?Followed by cardiology ? ?  ?  ?  ? Respiratory  ? Obstructive sleep apnea  ?  Uses CPAP regularly, continues to benefit from it ? ?  ?  ?  ? Nervous and Auditory  ? Lumbar radiculopathy  ?  Has had lumbar spine surgery in the past ?Followed by orthopedic surgery at [facility] ? ?  ?  ? Relevant Medications  ? escitalopram (LEXAPRO) 20 MG tablet  ?  ? Other  ? Hyperlipidemia  ?  On statin ?Check lipid profile ? ?  ?  ? Relevant Orders  ? Lipid Profile  ? Recurrent major depressive disorder, in partial remission ([place])  ?  Well controlled with Lexapro currently, refilled ? ?  ?  ? Relevant Medications  ? escitalopram (LEXAPRO) 20 MG tablet  ? ?Other Visit Diagnoses   ? ? Annual physical exam      ? Relevant Orders  ? TSH  ? Hemoglobin A1c  ? CMP14+EGFR  ? CBC with Differential/Platelet  ? Vitamin D deficiency      ? Relevant Orders  ? VITAMIN D 25 Hydroxy (Vit-D Deficiency, Fractures)  ? Prostate cancer screening      ? Relevant Orders  ? PSA  ? Need for hepatitis C screening test      ? Relevant Orders  ? Hepatitis C Antibody  ? ?  ? ? ?Outpatient Encounter Medications as of 05/23/2021  ?Medication Sig  ? amLODipine (NORVASC) 5 MG tablet TAKE (1) TABLET BY MOUTH ONCE DAILY.  ? aspirin EC 81 MG tablet Take 1 tablet (81 mg total) by mouth daily. Resume 4 days post-op (Patient taking differently: Take 81  mg by mouth every morning.)  ? atorvastatin (LIPITOR) 40 MG tablet Take 1 tablet (40 mg total) by mouth daily.  ? nitroGLYCERIN (NITROSTAT) 0.4 MG SL tablet Place 1 tablet (0.4 mg total) under the tongue every 5 (five) minut

## 2021-05-23 NOTE — Assessment & Plan Note (Signed)
BP Readings from Last 1 Encounters:  ?05/23/21 138/82  ? ?Well-controlled with amlodipine and lisinopril ?Counseled for compliance with the medications ?Advised DASH diet and moderate exercise/walking, at least 150 mins/week ? ?

## 2021-05-23 NOTE — Assessment & Plan Note (Signed)
On statin Check lipid profile 

## 2021-05-23 NOTE — Assessment & Plan Note (Signed)
Well controlled with Lexapro currently, refilled 

## 2021-05-23 NOTE — Assessment & Plan Note (Addendum)
Uses CPAP regularly, continues to benefit from it 

## 2021-09-27 ENCOUNTER — Encounter: Payer: Commercial Managed Care - PPO | Admitting: Internal Medicine

## 2022-01-29 ENCOUNTER — Other Ambulatory Visit: Payer: Self-pay | Admitting: Student

## 2022-03-01 ENCOUNTER — Other Ambulatory Visit: Payer: Self-pay | Admitting: Student

## 2022-04-29 ENCOUNTER — Other Ambulatory Visit: Payer: Self-pay | Admitting: Student

## 2022-05-10 ENCOUNTER — Encounter: Payer: Self-pay | Admitting: Student

## 2022-05-10 ENCOUNTER — Ambulatory Visit: Payer: Commercial Managed Care - PPO | Attending: Student | Admitting: Student

## 2022-05-10 VITALS — BP 116/76 | HR 59 | Ht 75.0 in | Wt 289.0 lb

## 2022-05-10 DIAGNOSIS — R0609 Other forms of dyspnea: Secondary | ICD-10-CM

## 2022-05-10 DIAGNOSIS — R0600 Dyspnea, unspecified: Secondary | ICD-10-CM

## 2022-05-10 DIAGNOSIS — E785 Hyperlipidemia, unspecified: Secondary | ICD-10-CM

## 2022-05-10 DIAGNOSIS — I10 Essential (primary) hypertension: Secondary | ICD-10-CM

## 2022-05-10 DIAGNOSIS — I25118 Atherosclerotic heart disease of native coronary artery with other forms of angina pectoris: Secondary | ICD-10-CM | POA: Diagnosis not present

## 2022-05-10 DIAGNOSIS — R002 Palpitations: Secondary | ICD-10-CM | POA: Diagnosis not present

## 2022-05-10 DIAGNOSIS — G4733 Obstructive sleep apnea (adult) (pediatric): Secondary | ICD-10-CM

## 2022-05-10 MED ORDER — PREDNISONE 50 MG PO TABS: ORAL_TABLET | ORAL | 0 refills | Status: DC

## 2022-05-10 MED ORDER — DIPHENHYDRAMINE HCL 50 MG PO TABS: ORAL_TABLET | ORAL | 0 refills | Status: DC

## 2022-05-10 MED ORDER — METOPROLOL TARTRATE 25 MG PO TABS: 25.0000 mg | ORAL_TABLET | ORAL | 0 refills | Status: DC

## 2022-05-10 MED ORDER — QUINAPRIL HCL 20 MG PO TABS: 20.0000 mg | ORAL_TABLET | Freq: Every day | ORAL | 3 refills | Status: DC

## 2022-05-10 MED ORDER — AMLODIPINE BESYLATE 5 MG PO TABS: 5.0000 mg | ORAL_TABLET | Freq: Every day | ORAL | 3 refills | Status: DC

## 2022-05-10 NOTE — Patient Instructions (Signed)
Medication Instructions:  Your physician has recommended you make the following change in your medication:   Take Prednisone 50 mg 13 hour, 7 hours and 1 hour prior to CT Scan  Take Benadryl 50 mg 1 hour prior to CT Scan  Take Lopressor 25 mg 2 hours prior to CT Scan   *If you need a refill on your cardiac medications before your next appointment, please call your pharmacy*   Lab Work: Your physician recommends that you return for lab work prior to CT scan.   If you have labs (blood work) drawn today and your tests are completely normal, you will receive your results only by: MyChart Message (if you have MyChart) OR A paper copy in the mail If you have any lab test that is abnormal or we need to change your treatment, we will call you to review the results.   Testing/Procedures:   Your cardiac CT will be scheduled at one of the below locations:   [facility] [address] [place], [place] [address] [phone]  OR  [facility] [address] [place], [place] [address] [phone]  OR   [facility] [address] [place], [place] [address] [phone]  If scheduled at [facility], please arrive at the [facility] and Children's Entrance (Entrance C2) of [facility] 30 minutes prior to test start time. You can use the FREE valet parking offered at entrance C (encouraged to control the heart rate for the test)  Proceed to the [facility] Radiology Department (first floor) to check-in and test prep.  All radiology patients and guests should use entrance C2 at [facility], accessed from [facility], even though the hospital's physical address listed is [address].    If scheduled at [facility] or [facility], please arrive 15 mins early for check-in and test prep.   Please follow these  instructions carefully (unless otherwise directed):  Hold all erectile dysfunction medications at least 3 days (72 hrs) prior to test. (Ie viagra, cialis, sildenafil, tadalafil, etc) We will administer nitroglycerin during this exam.   On the Night Before the Test: Be sure to Drink plenty of water. Do not consume any caffeinated/decaffeinated beverages or chocolate 12 hours prior to your test. Do not take any antihistamines 12 hours prior to your test. If the patient has contrast allergy: Patient will need a prescription for Prednisone and very clear instructions (as follows): Prednisone 50 mg - take 13 hours prior to test Take another Prednisone 50 mg 7 hours prior to test Take another Prednisone 50 mg 1 hour prior to test Take Benadryl 50 mg 1 hour prior to test Patient must complete all four doses of above prophylactic medications. Patient will need a ride after test due to Benadryl.  On the Day of the Test: Drink plenty of water until 1 hour prior to the test. Do not eat any food 1 hour prior to test. You may take your regular medications prior to the test.  Take metoprolol (Lopressor) two hours prior to test. If you take Furosemide/Hydrochlorothiazide/Spironolactone, please HOLD on the morning of the test.   *For Clinical Staff only. Please instruct patient the following:* Heart Rate Medication Recommendations for Cardiac CT  Resting HR < 50 bpm  No medication  Resting HR 50-60 bpm and BP >110/50 mmHG   Consider Metoprolol tartrate 25 mg PO 90-120 min prior to scan  Resting HR 60-65 bpm and BP >110/50 mmHG  Metoprolol tartrate 50 mg PO 90-120 minutes prior to scan   Resting HR > 65 bpm and BP >110/50 mmHG  Metoprolol tartrate 100 mg PO 90-120 minutes prior to scan  Consider Ivabradine 10-15 mg PO or a calcium channel blocker for resting HR >60 bpm and contraindication to metoprolol tartrate  Consider Ivabradine 10-15 mg PO in combination with metoprolol tartrate for HR >80  bpm         After the Test: Drink plenty of water. After receiving IV contrast, you may experience a mild flushed feeling. This is normal. On occasion, you may experience a mild rash up to 24 hours after the test. This is not dangerous. If this occurs, you can take Benadryl 25 mg and increase your fluid intake. If you experience trouble breathing, this can be serious. If it is severe call 911 IMMEDIATELY. If it is mild, please call our office. If you take any of these medications: Glipizide/Metformin, Avandament, Glucavance, please do not take 48 hours after completing test unless otherwise instructed.  We will call to schedule your test 2-4 weeks out understanding that some insurance companies will need an authorization prior to the service being performed.   For non-scheduling related questions, please contact the cardiac imaging nurse navigator should you have any questions/concerns: Rockwell AlexandriaSara Wallace, Cardiac Imaging Nurse Navigator Larey BrickMerle Prescott, Cardiac Imaging Nurse Navigator [facility] Direct Office Dial: [phone]   For scheduling needs, including cancellations and rescheduling, please call [place], [phone].    Follow-Up: At [facility], you and your health needs are our priority.  As part of our continuing mission to provide you with exceptional heart care, we have created designated Provider Care Teams.  These Care Teams include your primary Cardiologist (physician) and Advanced Practice Providers (APPs -  Physician Assistants and Nurse Practitioners) who all work together to provide you with the care you need, when you need it.  We recommend signing up for the patient portal called "MyChart".  Sign up information is provided on this After Visit Summary.  MyChart is used to connect with patients for Virtual Visits (Telemedicine).  Patients are able to view lab/test results, encounter notes, upcoming appointments, etc.  Non-urgent messages  can be sent to your provider as well.   To learn more about what you can do with MyChart, go to [email or web address].    Your next appointment:   1 year(s)  Provider:   You may see Nona DellSamuel McDowell, MD or one of the following Advanced Practice Providers on your designated Care Team:   Randall AnBrittany Strader, PA-C  Jacolyn ReedyMichele Lenze, PA-C     Other Instructions Thank you for choosing [facility]!

## 2022-05-10 NOTE — Progress Notes (Unsigned)
Cardiology Office Note    Date:  05/11/2022  ID:  [name], DOB 1960/06/12, MRN [id number] Cardiologist: Nona DellSamuel McDowell, MD    History of Present Illness:    [name] is a 62 y.o. male with past medical history of CAD (false positive NST in 08/2017 with cath in 09/2017 showing nonobstructive CAD), palpitations (prior monitor in 08/2019 showing PAC's and PVC's with brief SVT), HTN, HLD and OSA (on CPAP) who presents to the office today for overdue annual follow-up.  He was examined by myself in 01/2021 and denied any recent anginal symptoms at that time. He was using his CPAP on a nightly basis. He did report intermittent palpitations and had been off Lopressor. Options were reviewed in regards to restarting Lopressor or switching to Toprol-XL and he was to report back if symptoms progressed.  In talking with the patient today, he reports worsening dyspnea on exertion over the past few months. Notices this when walking on flat surfaces or inclines. Says that his weight has increased over the past few years and questions if this is due to deconditioning. Activity now limited due to arthritis in his knees and being followed by [facility]. He denies any associated chest pain or palpitations. No specific orthopnea, PND or pitting edema.  He does use his CPAP on a nightly basis.  He did have recent labs with his PCP in 03/2022 and brings them with him today. Pertinent labs showed hemoglobin 14.5, platelets 214, creatinine 0.97, Na+ 141, K+ 4.6, AST 32 and ALT mildly elevated at 46. Hemoglobin A1c 6.0. FLP showed total cholesterol 104, triglycerides 95, HDL 35 and LDL 51.   Studies Reviewed:   EKG: EKG is ordered today and demonstrates sinus bradycardia, HR 59 with no acute ST changes.   LHC: 09/2017 Prox LAD lesion is 10% stenosed. Mid Cx lesion is 10% stenosed. LV end diastolic pressure is mildly elevated. The left ventricular ejection fraction is 55-65% by visual estimate.    Nonobstructive CAD.  Continue preventive therapy.  Event Monitor: 08/2019 ZIO XT reviewed. 8 days 18 hours analyzed. Predominant rhythm is sinus with heart rate ranging from 37 bpm up to 154 bpm and average heart rate 59 bpm. Rare PACs were noted representing less than 1% of total beats. Occasional PVCs noted representing 2% of total beats. Patient triggered events tended to correlate with PVCs. Two episodes of SVT were noted, the longest of which lasted for only 7 beats. There were no sustained arrhythmias or pauses.    Physical Exam:   VS:  BP 116/76   Pulse (!) 59   Ht 6\' 3"  (1.905 m)   Wt 289 lb (131.1 kg)   SpO2 97%   BMI 36.12 kg/m    Wt Readings from Last 3 Encounters:  05/10/22 289 lb (131.1 kg)  05/23/21 281 lb 9.6 oz (127.7 kg)  01/19/21 278 lb (126.1 kg)     GEN: Pleasant male appearing in no acute distress NECK: No JVD; No carotid bruits CARDIAC: RRR, no murmurs, rubs, gallops RESPIRATORY:  Clear to auscultation without rales, wheezing or rhonchi  ABDOMEN: Appears non-distended. No obvious abdominal masses. EXTREMITIES: No clubbing or cyanosis. No pitting edema.  Distal pedal pulses are 2+ bilaterally.   Assessment and Plan:   1. CAD/Dyspnea on Exertion - Prior catheterization in 09/2017 showed nonobstructive CAD. He reports worsening dyspnea on exertion over the past few months. We reviewed options for repeat ischemic testing given the time frame since his last evaluation. A  NST is not ideal given his prior false positive stress test. Will plan for a Coronary CT. He is listed as having an intolerance to contrast as he had elevated BP and palpitations with this in the past. Denies any issues with his cath in 2019 as he received pre-treatment at that time. Therefore, will provide with Prednisone and Benadryl prior to his study. If this is reassuring, we reviewed he should increase his aerobic activity.   2. Palpitations - Prior monitor showed PAC's and PVC's with brief  SVT. He denies any recent palpitations. No longer on beta-blocker therapy given baseline bradycardia and no recent symptoms.   3. HTN - BP is well-controlled at 116/76 during today's visit. Continue current medical therapy with Amlodipine 5mg  daily and Quinapril 20mg  daily.   4. HLD - Recent FLP showed total cholesterol 104, triglycerides 95, HDL 35 and LDL 51. He reports he had been off statin therapy for over a year when this was obtained in 03/2022. Would recheck later this year as his cholesterol was previously significantly elevated, therefore I am concerned about the accuracy of his recent labs as he reports no significant dietary changes.   5. OSA - Continued compliance with CPAP encouraged.    Signed, Ellsworth Lennox, PA-C

## 2022-05-11 ENCOUNTER — Encounter: Payer: Self-pay | Admitting: Student

## 2022-05-17 ENCOUNTER — Other Ambulatory Visit (HOSPITAL_COMMUNITY)
Admission: RE | Admit: 2022-05-17 | Discharge: 2022-05-17 | Disposition: A | Discharge status: Home / Self Care | Payer: Commercial Managed Care - PPO | Source: Ambulatory Visit | Attending: Student | Admitting: Student

## 2022-05-17 DIAGNOSIS — R0609 Other forms of dyspnea: Secondary | ICD-10-CM

## 2022-05-17 DIAGNOSIS — I10 Essential (primary) hypertension: Secondary | ICD-10-CM

## 2022-05-17 LAB — BASIC METABOLIC PANEL
Anion gap: 7 (ref 5–15)
BUN: 15 mg/dL (ref 6–20)
CO2: 22 mmol/L (ref 22–32)
Calcium: 8.7 mg/dL — ABNORMAL LOW (ref 8.9–10.3)
Chloride: 105 mmol/L (ref 98–111)
Creatinine, Ser: 1.06 mg/dL (ref 0.61–1.24)
GFR, Estimated: >60 mL/min (ref >60)
Glucose, Bld: 129 mg/dL — ABNORMAL HIGH (ref 70–99)
Potassium: 3.8 mmol/L (ref 3.5–5.1)
Sodium: 134 mmol/L — ABNORMAL LOW (ref 135–145)

## 2022-05-24 ENCOUNTER — Ambulatory Visit (HOSPITAL_COMMUNITY): Admission: RE | Admit: 2022-05-24 | Payer: Commercial Managed Care - PPO | Source: Ambulatory Visit

## 2022-06-03 ENCOUNTER — Encounter: Payer: Self-pay | Admitting: Orthopedic Surgery

## 2022-06-03 ENCOUNTER — Ambulatory Visit (INDEPENDENT_AMBULATORY_CARE_PROVIDER_SITE_OTHER): Payer: Commercial Managed Care - PPO | Admitting: Orthopedic Surgery

## 2022-06-03 ENCOUNTER — Other Ambulatory Visit (INDEPENDENT_AMBULATORY_CARE_PROVIDER_SITE_OTHER): Payer: Commercial Managed Care - PPO

## 2022-06-03 VITALS — BP 160/100 | HR 61 | Ht 75.0 in | Wt 288.0 lb

## 2022-06-03 DIAGNOSIS — R202 Paresthesia of skin: Secondary | ICD-10-CM

## 2022-06-03 DIAGNOSIS — G5603 Carpal tunnel syndrome, bilateral upper limbs: Secondary | ICD-10-CM

## 2022-06-03 DIAGNOSIS — M79642 Pain in left hand: Secondary | ICD-10-CM

## 2022-06-03 MED ORDER — GABAPENTIN 100 MG PO CAPS
100.0000 mg | ORAL_CAPSULE | Freq: Every day | ORAL | 0 refills | Status: DC
Start: 2022-06-03 — End: 2023-03-25

## 2022-06-03 NOTE — Addendum Note (Signed)
Addended byCaffie Damme on: 06/03/2022 09:22 AM   Modules accepted: Orders

## 2022-06-03 NOTE — Progress Notes (Signed)
Office Visit Note   Patient: [name]           Date of Birth: 01-07-1961           MRN: [id number] Visit Date: 06/03/2022 Requested by: Elfredia Nevins, MD [address] 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    Dispense:  42 capsule    Refill:  0     Subjective: Chief Complaint  Patient presents with   New Patient (Initial Visit)    Bilateral hand pain numbness tingling 1-1/2 months LT>RT Electrocuted many years ago, pain increasing recently    HPI: 62 year old male gives a history of previous injections in his wrist for carpal tunnel syndrome years ago comes in with a 1-1/60-month history of numbness and tingling in his left hand despite wearing a brace at night.  When he wears the brace at night his symptoms are controlled  However he has numbness and tingling and burning sensation in the left hand somewhat in the right and the left is worse.  When he was driving from [place] yesterday his hand went numb while holding on the steering well.  He can shake the arm to try to get the feeling back which usually helps              ROS: Note ring finger feels like there is a match touching it when it is numbness and tingling in the other index and long finger just numbness and tingling   Images personally read and my interpretation : Internal imaging of the left hand was obtained  Visit Diagnoses:  1. Bilateral carpal tunnel syndrome    2. Pain in left hand      Follow-Up Instructions: No follow-ups on file.    Objective: Vital Signs: BP (!) 160/100   Pulse 61   Ht 6\' 3"  (1.905 m)   Wt 288 lb (130.6 kg)   BMI 36.00 kg/m   Physical Exam Vitals and nursing note reviewed.  Constitutional:      Appearance: Normal appearance.  HENT:     Head: Normocephalic and atraumatic.  Eyes:     General: No scleral icterus.       Right eye: No discharge.        Left eye: No discharge.     Extraocular Movements: Extraocular movements intact.     Conjunctiva/sclera: Conjunctivae normal.     Pupils: Pupils are equal, round, and reactive to light.  Cardiovascular:     Rate and Rhythm: Normal rate.     Pulses: Normal pulses.  Skin:    General: Skin is warm and dry.     Capillary Refill: Capillary refill takes less than 2 seconds.  Neurological:     General: No focal deficit present.     Mental Status: He is alert and oriented to person, place, and time.  Psychiatric:        Mood and Affect: Mood normal.  Behavior: Behavior normal.        Thought Content: Thought content normal.        Judgment: Judgment normal.      Left Hand Exam   Comments:  Left hand appearance color looks good there is no atrophy digits are aligned properly without nodularity of the interphalangeal joints  There is no tenderness at the wrist or fingers  Range of motion is normal  Wrist is stable  Motor exam intact  Compression test and Phalen's test negative  Normal soft touch       Specialty Comments:  No specialty comments available.  Imaging: DG Hand Complete Left  Result Date: 06/03/2022 Left hand series for pain and numbness and tingling in the left hand history of carpal tunnel syndrome X-rays show normal bony articulations alignment and joint surfaces Impression no arthritis left hand normal x-ray     PMFS History: Patient Active Problem List   Diagnosis Date Noted   Recurrent major depressive disorder, in  partial remission (HCC) 05/23/2021   CAD (coronary artery disease) 05/23/2021   Prostate cancer screening 05/23/2021   Trochanteric bursitis of right hip 06/23/2019   History of DVT (deep vein thrombosis) 08/26/2017   Chronic back pain greater than 3 months duration 08/26/2017   Scoliosis deformity of spine 08/05/2017   HNP (herniated nucleus pulposus), lumbar 05/08/2017   Lumbar radiculopathy 02/20/2017   Essential hypertension 10/15/2014   Hyperlipidemia 10/15/2014   Obstructive sleep apnea 10/15/2014   Chest pain 05/15/2012   PVC (premature ventricular contraction) 05/15/2012   CARPAL TUNNEL SYNDROME 12/14/2007   Past Medical History:  Diagnosis Date   Arthritis    "right knee"   Depression    Dysrhythmia    GERD (gastroesophageal reflux disease)    H/O cardiac catheterization    a.  09/23/2017: cath showed mild nonobstructive CAD with 10% proximal LAD stenosis and 10% mid LCx stenosis with a preserved EF of 55 to 65%.   High cholesterol    History of kidney stones    "33 years ago"   Hypertension    Irregular heart rhythm    a. PVC's by prior event monitor.    Pneumonia    Sleep apnea    wears CPAP    Family History  Problem Relation Age of Onset   Coronary artery disease Father    Diabetes Father    Heart disease Mother     Past Surgical History:  Procedure Laterality Date   BACK SURGERY     CATARACT EXTRACTION W/PHACO Right 08/16/2019   Procedure: CATARACT EXTRACTION PHACO AND INTRAOCULAR LENS PLACEMENT RIGHT EYE;  Surgeon: Fabio Pierce, MD;  Location: [facility];  Service: Ophthalmology;  Laterality: Right;  CDE: 4.56   HERNIA REPAIR     KNEE SURGERY Right    x2   LEFT HEART CATH AND CORONARY ANGIOGRAPHY N/A 09/23/2017   Procedure: LEFT HEART CATH AND CORONARY ANGIOGRAPHY;  Surgeon: Corky Crafts, MD;  Location: [facility] INVASIVE CV LAB;  Service: Cardiovascular;  Laterality: N/A;   LUMBAR LAMINECTOMY/DECOMPRESSION MICRODISCECTOMY N/A 05/08/2017   Procedure:  Microlumbar decompression Lumbar two-three;  Surgeon: Jene Every, MD;  Location: [facility] OR;  Service: Orthopedics;  Laterality: N/A;   LUMBAR LAMINECTOMY/DECOMPRESSION MICRODISCECTOMY N/A 05/16/2017   Procedure: REVISION LUMBAR DECOMPRESSION LUMBAR TWO TO LUMBAR THREE;  Surgeon: Jene Every, MD;  Location: [facility];  Service: Orthopedics;  Laterality: N/A;   Social History   Occupational History   Not on file  Tobacco Use   Smoking  status: Never   Smokeless tobacco: Never  Vaping Use   Vaping Use: Never used  Substance and Sexual Activity   Alcohol use: Yes    Alcohol/week: 0.0 standard drinks of alcohol    Comment: 3-4 beers a month "if that"   Drug use: No   Sexual activity: Not on file

## 2022-06-03 NOTE — Patient Instructions (Addendum)
Wear brace at night until the testing has been completed  I would advise 100 mg of gabapentin a day to control the numbness and tingling  Return to office 1 week to 10 days after the nerve study to review and discuss further treatment options  We are referring you to [facility] from [facility] address is [address] [place] The phone number is [phone]  The office will call you with an appointment Dr.  Alvester Morin

## 2022-06-10 ENCOUNTER — Other Ambulatory Visit: Payer: Self-pay | Admitting: Student

## 2022-06-12 ENCOUNTER — Telehealth: Payer: Self-pay | Admitting: Physical Medicine and Rehabilitation

## 2022-06-12 NOTE — Telephone Encounter (Signed)
Patient states his insurance need a billing code for his appointment. Please advise

## 2022-06-13 ENCOUNTER — Telehealth: Payer: Self-pay | Admitting: Radiology

## 2022-06-13 NOTE — Telephone Encounter (Signed)
Left detailed message giving codes for NCV [id number] and [phone]

## 2022-06-13 NOTE — Telephone Encounter (Signed)
Patient called to schedule the nerve study with Dr Alvester Morin then called back to cancel them after asking about cost and the codes  To you FYI

## 2022-06-21 ENCOUNTER — Encounter: Payer: Commercial Managed Care - PPO | Admitting: Physical Medicine and Rehabilitation

## 2022-11-29 ENCOUNTER — Other Ambulatory Visit (HOSPITAL_BASED_OUTPATIENT_CLINIC_OR_DEPARTMENT_OTHER): Payer: Self-pay

## 2022-11-29 MED ORDER — INFLUENZA VIRUS VACC SPLIT PF (FLUZONE) 0.5 ML IM SUSY
0.5000 mL | PREFILLED_SYRINGE | Freq: Once | INTRAMUSCULAR | 0 refills | Status: AC
  Filled 2022-11-29: qty 0.5, 1d supply, fill #0

## 2023-02-20 DIAGNOSIS — R0609 Other forms of dyspnea: Secondary | ICD-10-CM | POA: Diagnosis not present

## 2023-02-20 DIAGNOSIS — I872 Venous insufficiency (chronic) (peripheral): Secondary | ICD-10-CM | POA: Diagnosis not present

## 2023-02-20 DIAGNOSIS — E6609 Other obesity due to excess calories: Secondary | ICD-10-CM | POA: Diagnosis not present

## 2023-02-20 DIAGNOSIS — M159 Polyosteoarthritis, unspecified: Secondary | ICD-10-CM | POA: Diagnosis not present

## 2023-02-20 DIAGNOSIS — Z6836 Body mass index (BMI) 36.0-36.9, adult: Secondary | ICD-10-CM | POA: Diagnosis not present

## 2023-02-20 DIAGNOSIS — R011 Cardiac murmur, unspecified: Secondary | ICD-10-CM | POA: Diagnosis not present

## 2023-02-21 ENCOUNTER — Other Ambulatory Visit (HOSPITAL_COMMUNITY): Payer: Self-pay | Admitting: Internal Medicine

## 2023-02-21 DIAGNOSIS — Z136 Encounter for screening for cardiovascular disorders: Secondary | ICD-10-CM

## 2023-02-21 DIAGNOSIS — M79604 Pain in right leg: Secondary | ICD-10-CM

## 2023-02-28 ENCOUNTER — Ambulatory Visit (HOSPITAL_COMMUNITY): Payer: Commercial Managed Care - PPO

## 2023-02-28 ENCOUNTER — Encounter (HOSPITAL_COMMUNITY): Payer: Self-pay

## 2023-03-01 ENCOUNTER — Other Ambulatory Visit: Payer: Self-pay

## 2023-03-01 ENCOUNTER — Emergency Department (HOSPITAL_COMMUNITY): Payer: Worker's Compensation

## 2023-03-01 ENCOUNTER — Encounter (HOSPITAL_COMMUNITY): Payer: Self-pay

## 2023-03-01 ENCOUNTER — Emergency Department (HOSPITAL_COMMUNITY)
Admission: EM | Admit: 2023-03-01 | Discharge: 2023-03-01 | Disposition: A | Discharge status: Home / Self Care | Payer: Worker's Compensation | Attending: Emergency Medicine | Admitting: Emergency Medicine

## 2023-03-01 DIAGNOSIS — S2242XA Multiple fractures of ribs, left side, initial encounter for closed fracture: Secondary | ICD-10-CM | POA: Diagnosis not present

## 2023-03-01 DIAGNOSIS — S20212A Contusion of left front wall of thorax, initial encounter: Secondary | ICD-10-CM | POA: Insufficient documentation

## 2023-03-01 DIAGNOSIS — R918 Other nonspecific abnormal finding of lung field: Secondary | ICD-10-CM | POA: Diagnosis not present

## 2023-03-01 DIAGNOSIS — Z7982 Long term (current) use of aspirin: Secondary | ICD-10-CM | POA: Insufficient documentation

## 2023-03-01 DIAGNOSIS — S299XXA Unspecified injury of thorax, initial encounter: Secondary | ICD-10-CM | POA: Diagnosis present

## 2023-03-01 MED ORDER — ACETAMINOPHEN 325 MG PO TABS
650.0000 mg | ORAL_TABLET | Freq: Once | ORAL | Status: AC
  Administered 2023-03-01: 650 mg via ORAL
  Filled 2023-03-01: qty 2

## 2023-03-01 NOTE — Discharge Instructions (Signed)
You may apply ice or heat on and off to your rib area.  Over-the-counter Tylenol or ibuprofen if needed for pain.  Your x-rays today did not show evidence of broken ribs or punctured lung.  I recommend that you follow-up with your primary care provider for recheck in 1 week if your symptoms are not improving.  Please return to the emergency department if you develop any shortness of breath or sudden worsening pain
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
## 2023-03-03 ENCOUNTER — Encounter (HOSPITAL_COMMUNITY): Payer: Self-pay | Admitting: Emergency Medicine

## 2023-03-03 ENCOUNTER — Emergency Department (HOSPITAL_COMMUNITY): Payer: Worker's Compensation

## 2023-03-03 ENCOUNTER — Other Ambulatory Visit: Payer: Self-pay

## 2023-03-03 ENCOUNTER — Emergency Department (HOSPITAL_COMMUNITY)
Admission: EM | Admit: 2023-03-03 | Discharge: 2023-03-03 | Disposition: A | Discharge status: Home / Self Care | Payer: Worker's Compensation | Attending: Emergency Medicine | Admitting: Emergency Medicine

## 2023-03-03 ENCOUNTER — Emergency Department (HOSPITAL_COMMUNITY): Payer: BLUE CROSS/BLUE SHIELD

## 2023-03-03 DIAGNOSIS — Z20822 Contact with and (suspected) exposure to covid-19: Secondary | ICD-10-CM | POA: Diagnosis not present

## 2023-03-03 DIAGNOSIS — Z7982 Long term (current) use of aspirin: Secondary | ICD-10-CM | POA: Diagnosis not present

## 2023-03-03 DIAGNOSIS — J9811 Atelectasis: Secondary | ICD-10-CM | POA: Diagnosis not present

## 2023-03-03 DIAGNOSIS — S20212S Contusion of left front wall of thorax, sequela: Secondary | ICD-10-CM | POA: Diagnosis not present

## 2023-03-03 DIAGNOSIS — R051 Acute cough: Secondary | ICD-10-CM | POA: Insufficient documentation

## 2023-03-03 DIAGNOSIS — Y990 Civilian activity done for income or pay: Secondary | ICD-10-CM | POA: Diagnosis not present

## 2023-03-03 DIAGNOSIS — R918 Other nonspecific abnormal finding of lung field: Secondary | ICD-10-CM | POA: Diagnosis not present

## 2023-03-03 DIAGNOSIS — R0989 Other specified symptoms and signs involving the circulatory and respiratory systems: Secondary | ICD-10-CM | POA: Diagnosis not present

## 2023-03-03 DIAGNOSIS — R0781 Pleurodynia: Secondary | ICD-10-CM | POA: Diagnosis not present

## 2023-03-03 DIAGNOSIS — Z79899 Other long term (current) drug therapy: Secondary | ICD-10-CM | POA: Insufficient documentation

## 2023-03-03 DIAGNOSIS — S299XXD Unspecified injury of thorax, subsequent encounter: Secondary | ICD-10-CM | POA: Diagnosis present

## 2023-03-03 DIAGNOSIS — I10 Essential (primary) hypertension: Secondary | ICD-10-CM | POA: Insufficient documentation

## 2023-03-03 DIAGNOSIS — R079 Chest pain, unspecified: Secondary | ICD-10-CM | POA: Diagnosis not present

## 2023-03-03 DIAGNOSIS — S299XXA Unspecified injury of thorax, initial encounter: Secondary | ICD-10-CM | POA: Diagnosis not present

## 2023-03-03 LAB — RESP PANEL BY RT-PCR (RSV, FLU A&B, COVID)  RVPGX2
Influenza A by PCR: NEGATIVE
Influenza B by PCR: NEGATIVE
Resp Syncytial Virus by PCR: NEGATIVE
SARS Coronavirus 2 by RT PCR: NEGATIVE

## 2023-03-03 MED ORDER — OXYCODONE-ACETAMINOPHEN 5-325 MG PO TABS
1.0000 | ORAL_TABLET | Freq: Once | ORAL | Status: AC
  Administered 2023-03-03: 1 via ORAL
  Filled 2023-03-03: qty 1

## 2023-03-03 MED ORDER — OXYCODONE-ACETAMINOPHEN 5-325 MG PO TABS: 1.0000 | ORAL_TABLET | Freq: Three times a day (TID) | ORAL | 0 refills | Status: DC | PRN

## 2023-03-03 MED ORDER — BENZONATATE 100 MG PO CAPS: 100.0000 mg | ORAL_CAPSULE | Freq: Three times a day (TID) | ORAL | 0 refills | Status: DC

## 2023-03-03 MED ORDER — GUAIFENESIN-DM 100-10 MG/5ML PO SYRP
5.0000 mL | ORAL_SOLUTION | Freq: Once | ORAL | Status: AC
  Administered 2023-03-03: 5 mL via ORAL
  Filled 2023-03-03: qty 5

## 2023-03-03 NOTE — Discharge Instructions (Signed)
As we discussed, your workup in the ER today was reassuring for acute findings.  You tested negative for COVID, flu, and RSV.  Your chest x-ray and chest CT did not show any broken ribs.  I suspect that your pain is muscular in nature worsened by your cough.  I have given you a prescription for Percocet which you can take as prescribed as needed for severe pain only.  Do not drive or operate heavy machinery with taking this medication as it can be sedating.  We have also given you prescription that you can take as prescribed as needed for management of your cough.  Please use the incentive spirometer that we have provided for you regularly as well to help prevent developing a pneumonia.  Follow-up closely with your primary care doctor.  Return if development of any new or worsening symptoms.
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
## 2023-03-03 NOTE — ED Notes (Signed)
ED Provider at bedside.

## 2023-03-03 NOTE — ED Notes (Signed)
Patient transported to CT

## 2023-03-03 NOTE — ED Notes (Signed)
Patient transported to X-ray

## 2023-03-03 NOTE — ED Notes (Signed)
Returned from [employer].
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
## 2023-03-21 ENCOUNTER — Other Ambulatory Visit (HOSPITAL_COMMUNITY)
Admission: RE | Admit: 2023-03-21 | Discharge: 2023-03-21 | Disposition: A | Discharge status: Home / Self Care | Payer: Worker's Compensation | Source: Ambulatory Visit | Attending: Internal Medicine | Admitting: Internal Medicine

## 2023-03-21 DIAGNOSIS — Z0001 Encounter for general adult medical examination with abnormal findings: Secondary | ICD-10-CM | POA: Insufficient documentation

## 2023-03-21 LAB — CBC WITH DIFFERENTIAL/PLATELET
Abs Immature Granulocytes: 0.02 10*3/uL (ref 0.00–0.07)
Basophils Absolute: 0.1 10*3/uL (ref 0.0–0.1)
Basophils Relative: 1 %
Eosinophils Absolute: 0.3 10*3/uL (ref 0.0–0.5)
Eosinophils Relative: 4 %
HCT: 44.7 % (ref 39.0–52.0)
Hemoglobin: 15.7 g/dL (ref 13.0–17.0)
Immature Granulocytes: 0 %
Lymphocytes Relative: 39 %
Lymphs Abs: 2.5 10*3/uL (ref 0.7–4.0)
MCH: 29.8 pg (ref 26.0–34.0)
MCHC: 35.1 g/dL (ref 30.0–36.0)
MCV: 85 fL (ref 80.0–100.0)
Monocytes Absolute: 0.7 10*3/uL (ref 0.1–1.0)
Monocytes Relative: 10 %
Neutro Abs: 2.9 10*3/uL (ref 1.7–7.7)
Neutrophils Relative %: 46 %
Platelets: 240 10*3/uL (ref 150–400)
RBC: 5.26 MIL/uL (ref 4.22–5.81)
RDW: 13.2 % (ref 11.5–15.5)
WBC: 6.3 10*3/uL (ref 4.0–10.5)
nRBC: 0 % (ref 0.0–0.2)

## 2023-03-21 LAB — COMPREHENSIVE METABOLIC PANEL
ALT: 57 U/L — ABNORMAL HIGH (ref 0–44)
AST: 36 U/L (ref 15–41)
Albumin: 4.1 g/dL (ref 3.5–5.0)
Alkaline Phosphatase: 67 U/L (ref 38–126)
Anion gap: 8 (ref 5–15)
BUN: 16 mg/dL (ref 6–20)
CO2: 22 mmol/L (ref 22–32)
Calcium: 9 mg/dL (ref 8.9–10.3)
Chloride: 105 mmol/L (ref 98–111)
Creatinine, Ser: 0.96 mg/dL (ref 0.61–1.24)
GFR, Estimated: >60 mL/min (ref >60)
Glucose, Bld: 102 mg/dL — ABNORMAL HIGH (ref 70–99)
Potassium: 4 mmol/L (ref 3.5–5.1)
Sodium: 135 mmol/L (ref 135–145)
Total Bilirubin: 0.9 mg/dL (ref 0.0–1.2)
Total Protein: 7.2 g/dL (ref 6.5–8.1)

## 2023-03-21 LAB — VITAMIN B12: Vitamin B-12: 192 pg/mL (ref 180–914)

## 2023-03-21 LAB — LIPID PANEL
Cholesterol: 185 mg/dL (ref 0–200)
HDL: 39 mg/dL — ABNORMAL LOW (ref >40)
LDL Cholesterol: 111 mg/dL — ABNORMAL HIGH (ref 0–99)
Total CHOL/HDL Ratio: 4.7 {ratio}
Triglycerides: 175 mg/dL — ABNORMAL HIGH (ref <150)
VLDL: 35 mg/dL (ref 0–40)

## 2023-03-21 LAB — VITAMIN D 25 HYDROXY (VIT D DEFICIENCY, FRACTURES): Vit D, 25-Hydroxy: 23.47 ng/mL — ABNORMAL LOW (ref 30–100)

## 2023-03-21 LAB — TSH: TSH: 3.777 u[IU]/mL (ref 0.350–4.500)

## 2023-03-21 LAB — FOLATE: Folate: 9.7 ng/mL (ref >5.9)

## 2023-03-22 LAB — MISC LABCORP TEST (SEND OUT): Labcorp test code: 10322

## 2023-03-25 ENCOUNTER — Encounter: Payer: Self-pay | Admitting: Student

## 2023-03-25 ENCOUNTER — Ambulatory Visit: Payer: BLUE CROSS/BLUE SHIELD | Attending: Student | Admitting: Student

## 2023-03-25 VITALS — BP 124/82 | HR 61 | Ht 75.0 in | Wt 290.0 lb

## 2023-03-25 DIAGNOSIS — G4733 Obstructive sleep apnea (adult) (pediatric): Secondary | ICD-10-CM | POA: Diagnosis not present

## 2023-03-25 DIAGNOSIS — E785 Hyperlipidemia, unspecified: Secondary | ICD-10-CM

## 2023-03-25 DIAGNOSIS — I2510 Atherosclerotic heart disease of native coronary artery without angina pectoris: Secondary | ICD-10-CM | POA: Diagnosis not present

## 2023-03-25 DIAGNOSIS — R002 Palpitations: Secondary | ICD-10-CM

## 2023-03-25 DIAGNOSIS — R0609 Other forms of dyspnea: Secondary | ICD-10-CM

## 2023-03-25 DIAGNOSIS — I10 Essential (primary) hypertension: Secondary | ICD-10-CM

## 2023-03-25 DIAGNOSIS — R918 Other nonspecific abnormal finding of lung field: Secondary | ICD-10-CM

## 2023-03-25 MED ORDER — AMLODIPINE BESYLATE 5 MG PO TABS: 5.0000 mg | ORAL_TABLET | Freq: Every day | ORAL | 3 refills | Status: AC

## 2023-03-25 NOTE — Patient Instructions (Signed)
Medication Instructions:  Your physician recommends that you continue on your current medications as directed. Please refer to the Current Medication list given to you today.  *If you need a refill on your cardiac medications before your next appointment, please call your pharmacy*   Lab Work: None If you have labs (blood work) drawn today and your tests are completely normal, you will receive your results only by: MyChart Message (if you have MyChart) OR A paper copy in the mail If you have any lab test that is abnormal or we need to change your treatment, we will call you to review the results.   Testing/Procedures: None   Follow-Up: At [facility], you and your health needs are our priority.  As part of our continuing mission to provide you with exceptional heart care, we have created designated Provider Care Teams.  These Care Teams include your primary Cardiologist (physician) and Advanced Practice Providers (APPs -  Physician Assistants and Nurse Practitioners) who all work together to provide you with the care you need, when you need it.  We recommend signing up for the patient portal called "MyChart".  Sign up information is provided on this After Visit Summary.  MyChart is used to connect with patients for Virtual Visits (Telemedicine).  Patients are able to view lab/test results, encounter notes, upcoming appointments, etc.  Non-urgent messages can be sent to your provider as well.   To learn more about what you can do with MyChart, go to [email or web address].    Your next appointment:   12 month(s)  Provider:   You may see Nona Dell, MD or one of the following Advanced Practice Providers on your designated Care Team:   Randall An, PA-C  Jacolyn Reedy, [place]     Other Instructions
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
## 2023-03-26 ENCOUNTER — Encounter: Payer: Self-pay | Admitting: Student

## 2023-04-29 DIAGNOSIS — R059 Cough, unspecified: Secondary | ICD-10-CM | POA: Diagnosis not present

## 2023-04-29 DIAGNOSIS — J22 Unspecified acute lower respiratory infection: Secondary | ICD-10-CM | POA: Diagnosis not present

## 2023-09-18 DIAGNOSIS — H2512 Age-related nuclear cataract, left eye: Secondary | ICD-10-CM | POA: Diagnosis not present

## 2023-09-18 DIAGNOSIS — Z961 Presence of intraocular lens: Secondary | ICD-10-CM | POA: Diagnosis not present

## 2023-09-18 DIAGNOSIS — H26491 Other secondary cataract, right eye: Secondary | ICD-10-CM | POA: Diagnosis not present

## 2023-09-18 DIAGNOSIS — H43393 Other vitreous opacities, bilateral: Secondary | ICD-10-CM | POA: Diagnosis not present

## 2023-10-20 DIAGNOSIS — H26491 Other secondary cataract, right eye: Secondary | ICD-10-CM | POA: Diagnosis not present

## 2023-11-06 DIAGNOSIS — R6883 Chills (without fever): Secondary | ICD-10-CM | POA: Diagnosis not present

## 2023-11-06 DIAGNOSIS — U071 COVID-19: Secondary | ICD-10-CM | POA: Diagnosis not present

## 2023-11-06 DIAGNOSIS — Z6837 Body mass index (BMI) 37.0-37.9, adult: Secondary | ICD-10-CM | POA: Diagnosis not present

## 2023-11-06 DIAGNOSIS — E669 Obesity, unspecified: Secondary | ICD-10-CM | POA: Diagnosis not present

## 2023-12-18 DIAGNOSIS — M5417 Radiculopathy, lumbosacral region: Secondary | ICD-10-CM | POA: Diagnosis not present

## 2023-12-18 DIAGNOSIS — I10 Essential (primary) hypertension: Secondary | ICD-10-CM | POA: Diagnosis not present

## 2023-12-18 DIAGNOSIS — E291 Testicular hypofunction: Secondary | ICD-10-CM | POA: Diagnosis not present

## 2023-12-18 DIAGNOSIS — F329 Major depressive disorder, single episode, unspecified: Secondary | ICD-10-CM | POA: Diagnosis not present

## 2023-12-22 DIAGNOSIS — M79671 Pain in right foot: Secondary | ICD-10-CM | POA: Diagnosis not present

## 2023-12-22 DIAGNOSIS — M7661 Achilles tendinitis, right leg: Secondary | ICD-10-CM | POA: Diagnosis not present

## 2023-12-26 DIAGNOSIS — E291 Testicular hypofunction: Secondary | ICD-10-CM | POA: Diagnosis not present

## 2024-02-11 ENCOUNTER — Ambulatory Visit (HOSPITAL_BASED_OUTPATIENT_CLINIC_OR_DEPARTMENT_OTHER)

## 2024-02-16 ENCOUNTER — Ambulatory Visit (HOSPITAL_BASED_OUTPATIENT_CLINIC_OR_DEPARTMENT_OTHER)

## 2024-02-23 ENCOUNTER — Ambulatory Visit (INDEPENDENT_AMBULATORY_CARE_PROVIDER_SITE_OTHER): Payer: PRIVATE HEALTH INSURANCE

## 2024-02-23 ENCOUNTER — Encounter (HOSPITAL_BASED_OUTPATIENT_CLINIC_OR_DEPARTMENT_OTHER): Payer: Self-pay

## 2024-02-23 VITALS — BP 144/69 | HR 59 | Ht 75.0 in | Wt 303.0 lb

## 2024-02-23 DIAGNOSIS — R911 Solitary pulmonary nodule: Secondary | ICD-10-CM

## 2024-02-23 DIAGNOSIS — G4733 Obstructive sleep apnea (adult) (pediatric): Secondary | ICD-10-CM | POA: Diagnosis not present

## 2024-02-23 DIAGNOSIS — R918 Other nonspecific abnormal finding of lung field: Secondary | ICD-10-CM

## 2024-02-23 NOTE — Progress Notes (Signed)
 "  @Patient  ID: [name], male    DOB: Sep 28, 1960, 64 y.o.   MRN: [id number]  Chief Complaint  Patient presents with   Establish Care    New sleep     Referring provider: Bertell Satterfield, MD  HPI: Discussed the use of AI scribe software for clinical note transcription with the patient, who gave verbal consent to proceed.  History of Present Illness [name] is a 64 year old male with PMH of moderate sleep apnea who presents with issues related to his CPAP machine and sleep quality. He is accompanied by his spouse.  He has been experiencing ongoing issues with his CPAP machine, which is over ten years old and showing signs of wear, such as exceeding the life of the motor. He experiences frequent awakenings, approximately five to six times per night, due to the machine's pressure becoming too high, requiring him to turn it off and on again. The machine sometimes blows too hard, making it difficult to sleep. Despite these issues, he wears the CPAP mask throughout the night, averaging between seven to ten hours of use, depending on his work schedule. He uses a full face mask and has noticed increased snoring.  He reports that if he does not use the machine, he feels poorly the next day.  He does not sleep without it.  He was diagnosed with moderate sleep apnea approximately twenty years ago following a sleep study. He has not had a recent sleep study, and his current machine does not have an SD card for compliance data. He has gained approximately fifteen pounds over the last two years.  He experiences significant discomfort when attempting to sleep without the CPAP machine, including a raw and swollen throat, difficulty talking, swallowing, and breathing the following day.  His current medications include amlodipine , Lexapro , aspirin , meloxicam , and a generic form of Flexeril , all taken in the morning. He denies smoking, drug use, and reports minimal alcohol consumption. He works  as a emergency planning/management officer and has a history of high blood pressure and irregular heart rhythms, including PVCs, for which he is on medication.     TEST/EVENTS : prior sleep study completed 20 years ago- report not available for review.  Chest CT 03/03/2023:  IMPRESSION: 1. No acute abnormality in the chest. 2. Several tiny nodules scattered throughout the lungs, some of these nodules are calcified. Largest nodule measures 4 mm. No follow-up needed if patient is low-risk (and has no known or suspected primary neoplasm). Non-contrast chest CT can be considered in 12 months if patient is high-risk. This recommendation follows the consensus statement: Guidelines for Management of Incidental Pulmonary Nodules Detected on CT Images: From the Fleischner Society 2017; Radiology 2017; 284:228-243. 3. Coronary artery calcifications. 4. Aortic Atherosclerosis (ICD10-I70.0). 5. 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  Allergies[1]  Immunization History  Administered Date(s) Administered   Influenza, Seasonal, Injecte, Preservative Fre 11/29/2022   Influenza-Unspecified 12/05/2017    Past Medical History:  Diagnosis Date   Arthritis    right knee   Depression    Dysrhythmia    GERD (gastroesophageal reflux disease)    H/O cardiac catheterization    a.  09/23/2017: cath showed mild nonobstructive CAD with 10% proximal LAD stenosis and 10% mid LCx stenosis with a preserved EF of 55 to 65%.   High cholesterol    History of kidney stones    33 years ago   Hypertension    Irregular heart  rhythm    a. PVC's by prior event monitor.    Pneumonia    Sleep apnea    wears CPAP    Tobacco History: Tobacco Use History[2] Counseling given: Not Answered   Outpatient Medications Prior to Visit  Medication Sig Dispense Refill   amLODipine  (NORVASC ) 5 MG tablet Take 1 tablet (5 mg total) by mouth daily. 90 tablet 3   escitalopram  (LEXAPRO ) 20 MG tablet Take 1  tablet (20 mg total) by mouth every morning. 30 tablet 5   aspirin  EC 81 MG tablet Take 1 tablet (81 mg total) by mouth daily. Resume 4 days post-op (Patient taking differently: Take 81 mg by mouth every morning.)     No facility-administered medications prior to visit.     Review of Systems: as per hpi  Constitutional:   No  weight loss, night sweats,  Fevers, chills, fatigue, or  lassitude.  HEENT:   No headaches,  Difficulty swallowing,  Tooth/dental problems, or  Sore throat,                No sneezing, itching, ear ache, nasal congestion, post nasal drip,   CV:  No chest pain,  Orthopnea, PND, swelling in lower extremities, anasarca, dizziness, palpitations, syncope.   GI  No heartburn, indigestion, abdominal pain, nausea, vomiting, diarrhea, change in bowel habits, loss of appetite, bloody stools.   Resp: No shortness of breath with exertion or at rest.  No excess mucus, no productive cough,  No non-productive cough,  No coughing up of blood.  No change in color of mucus.  No wheezing.  No chest wall deformity  Skin: no rash or lesions.  GU: no dysuria, change in color of urine, no urgency or frequency.  No flank pain, no hematuria   MS:  No joint pain or swelling.  No decreased range of motion.  No back pain.    Physical Exam  BP (!) 144/69   Pulse (!) 59   Ht 6' 3 (1.905 m)   Wt (!) 303 lb (137.4 kg)   SpO2 95%   BMI 37.87 kg/m   GEN: A/Ox3; pleasant , NAD, well nourished    HEENT:  [place]/AT,  EACs-clear, TMs-wnl, NOSE-clear, THROAT-clear, no lesions, no postnasal drip or exudate noted. Mallampati 4  NECK:  Supple w/ fair ROM; no JVD; normal carotid impulses w/o bruits; no thyromegaly or nodules palpated; no lymphadenopathy.    RESP  Clear  P & A; w/o, wheezes/ rales/ or rhonchi. no accessory muscle use, no dullness to percussion  CARD:  RRR, no m/r/g, no peripheral edema, pulses intact, no cyanosis or clubbing.  GI:   Soft & nt; nml bowel sounds; no organomegaly  or masses detected.   Musco: Warm bil, no deformities or joint swelling noted.   Neuro: alert, no focal deficits noted.    Skin: Warm, no lesions or rashes    Lab Results:  CBC    Component Value Date/Time   WBC 6.3 03/21/2023 0818   RBC 5.26 03/21/2023 0818   HGB 15.7 03/21/2023 0818   HCT 44.7 03/21/2023 0818   PLT 240 03/21/2023 0818   MCV 85.0 03/21/2023 0818   MCH 29.8 03/21/2023 0818   MCHC 35.1 03/21/2023 0818   RDW 13.2 03/21/2023 0818   LYMPHSABS 2.5 03/21/2023 0818   MONOABS 0.7 03/21/2023 0818   EOSABS 0.3 03/21/2023 0818   BASOSABS 0.1 03/21/2023 0818    BMET    Component Value Date/Time   NA 135 03/21/2023  0818   K 4.0 03/21/2023 0818   CL 105 03/21/2023 0818   CO2 22 03/21/2023 0818   GLUCOSE 102 (H) 03/21/2023 0818   BUN 16 03/21/2023 0818   CREATININE 0.96 03/21/2023 0818   CALCIUM  9.0 03/21/2023 0818   GFRNONAA >60 03/21/2023 0818   GFRAA >60 06/23/2019 0918    BNP No results found for: BNP  ProBNP No results found for: PROBNP  Imaging: No results found.  Administration History     None           No data to display          No results found for: NITRICOXIDE   Assessment & Plan:   Assessment & Plan OSA (obstructive sleep apnea)  Lung nodule  Assessment and Plan Assessment & Plan Obstructive sleep apnea Moderate obstructive sleep apnea with suboptimal CPAP efficacy due to outdated equipment and weight gain. Epworth score of 8, continued snoring despite compliant machine usage suggests inadequate treatment. - Ordered new auto-adjusting CPAP machine. - If new CPAP is not approved, will order titration study to determine optimal pressure settings. - Will schedule follow-up in 30 to 90 days to assess new machine efficacy and troubleshoot issues. - Provided information about sleep study for patient education.  Lung nodules -  Seen on routine CT completed in January 2025 -  Address repeat CT at follow up  appt  Return in about 3 months (around 05/23/2024) for sleep study review; compliance download.  Candis Dandy, PA-C 02/23/2024      [1]  Allergies Allergen Reactions   Iodinated Contrast Media Palpitations and Hypertension   Iohexol  Palpitations and Hypertension    Increased blood pressure   [2]  Social History Tobacco Use  Smoking Status Never  Smokeless Tobacco Never   "

## 2024-02-23 NOTE — Patient Instructions (Signed)
 New CPAP ordered today.   Follow up in 3 months.

## 2024-02-23 NOTE — Progress Notes (Signed)
 Epworth Sleepiness Scale  Use the following scale to choose the most appropriate number for each situation. 0 Would never nod off 1  Slight  chance of nodding off 2 Moderate chance of nodding off 3 High chance of nodding off  Sitting and reading: 3 Watching TV: 2 Sitting, inactive, in a public place (e.g., in a meeting, theater, or dinner event): 0 As a passenger in a car for an hour or more without stopping for a break: 0 Lying down to rest when circumstances permit:3 Sitting and talking to someone: 0 Sitting quietly after a meal without alcohol: 0 In a car, while stopped for a few  minutes in traffic or at a light: 0  TOTOAL: 8

## 2024-02-24 ENCOUNTER — Telehealth (HOSPITAL_BASED_OUTPATIENT_CLINIC_OR_DEPARTMENT_OTHER): Payer: Self-pay

## 2024-02-24 NOTE — Telephone Encounter (Signed)
 Order received; however, I need a copy of the sleep study to process. I see a tab listed as clinical ref tab dated 02-23-2024 but I cannot open this file. not sure if that is the study or not.   Order on hold currently awaiting sleep study.   Thank you,   Arvella New  with [employer]  Please advise

## 2024-03-02 NOTE — Telephone Encounter (Signed)
 Were you able to get a copy of his sleep study?

## 2024-03-03 ENCOUNTER — Other Ambulatory Visit (HOSPITAL_BASED_OUTPATIENT_CLINIC_OR_DEPARTMENT_OTHER): Payer: Self-pay

## 2024-03-03 DIAGNOSIS — G4733 Obstructive sleep apnea (adult) (pediatric): Secondary | ICD-10-CM

## 2024-03-24 ENCOUNTER — Ambulatory Visit: Payer: PRIVATE HEALTH INSURANCE | Admitting: Student

## 2024-05-12 ENCOUNTER — Ambulatory Visit (HOSPITAL_BASED_OUTPATIENT_CLINIC_OR_DEPARTMENT_OTHER): Payer: PRIVATE HEALTH INSURANCE | Admitting: Pulmonary Disease
